# Patient Record
Sex: Female | Born: 1935 | Race: White | Hispanic: No | Marital: Married | Smoking: Never smoker
Health system: Southern US, Community
[De-identification: ages and names within clinical notes are randomized; demographics above are authoritative.]

## PROBLEM LIST (undated history)

## (undated) DIAGNOSIS — K851 Biliary acute pancreatitis without necrosis or infection: Secondary | ICD-10-CM

## (undated) DIAGNOSIS — K2270 Barrett's esophagus without dysplasia: Secondary | ICD-10-CM

## (undated) DIAGNOSIS — R269 Unspecified abnormalities of gait and mobility: Secondary | ICD-10-CM

## (undated) DIAGNOSIS — I10 Essential (primary) hypertension: Secondary | ICD-10-CM

## (undated) DIAGNOSIS — M1990 Unspecified osteoarthritis, unspecified site: Secondary | ICD-10-CM

## (undated) DIAGNOSIS — J8410 Pulmonary fibrosis, unspecified: Secondary | ICD-10-CM

## (undated) DIAGNOSIS — I4510 Unspecified right bundle-branch block: Secondary | ICD-10-CM

## (undated) DIAGNOSIS — R569 Unspecified convulsions: Secondary | ICD-10-CM

## (undated) DIAGNOSIS — D126 Benign neoplasm of colon, unspecified: Secondary | ICD-10-CM

## (undated) DIAGNOSIS — M353 Polymyalgia rheumatica: Secondary | ICD-10-CM

## (undated) DIAGNOSIS — K219 Gastro-esophageal reflux disease without esophagitis: Secondary | ICD-10-CM

## (undated) DIAGNOSIS — D649 Anemia, unspecified: Secondary | ICD-10-CM

## (undated) DIAGNOSIS — R112 Nausea with vomiting, unspecified: Secondary | ICD-10-CM

## (undated) DIAGNOSIS — R5381 Other malaise: Secondary | ICD-10-CM

## (undated) DIAGNOSIS — K644 Residual hemorrhoidal skin tags: Secondary | ICD-10-CM

## (undated) DIAGNOSIS — E785 Hyperlipidemia, unspecified: Secondary | ICD-10-CM

## (undated) DIAGNOSIS — E119 Type 2 diabetes mellitus without complications: Secondary | ICD-10-CM

## (undated) DIAGNOSIS — R5383 Other fatigue: Secondary | ICD-10-CM

## (undated) DIAGNOSIS — Z8719 Personal history of other diseases of the digestive system: Secondary | ICD-10-CM

## (undated) DIAGNOSIS — Z87898 Personal history of other specified conditions: Secondary | ICD-10-CM

## (undated) DIAGNOSIS — Z9889 Other specified postprocedural states: Secondary | ICD-10-CM

## (undated) DIAGNOSIS — K5792 Diverticulitis of intestine, part unspecified, without perforation or abscess without bleeding: Secondary | ICD-10-CM

## (undated) DIAGNOSIS — N390 Urinary tract infection, site not specified: Secondary | ICD-10-CM

## (undated) HISTORY — PX: BACK SURGERY: SHX140

## (undated) HISTORY — PX: OTHER SURGICAL HISTORY: SHX169

## (undated) HISTORY — PX: WRIST FRACTURE SURGERY: SHX121

## (undated) HISTORY — DX: Anemia, unspecified: D64.9

## (undated) HISTORY — DX: Personal history of other specified conditions: Z87.898

## (undated) HISTORY — DX: Unspecified right bundle-branch block: I45.10

## (undated) HISTORY — DX: Biliary acute pancreatitis without necrosis or infection: K85.10

## (undated) HISTORY — DX: Hyperlipidemia, unspecified: E78.5

## (undated) HISTORY — DX: Barrett's esophagus without dysplasia: K22.70

## (undated) HISTORY — DX: Personal history of other diseases of the digestive system: Z87.19

## (undated) HISTORY — PX: ESOPHAGOGASTRODUODENOSCOPY: SHX1529

## (undated) HISTORY — PX: CYSTOSCOPY: SUR368

## (undated) HISTORY — DX: Type 2 diabetes mellitus without complications: E11.9

## (undated) HISTORY — DX: Essential (primary) hypertension: I10

## (undated) HISTORY — PX: DILATION AND CURETTAGE OF UTERUS: SHX78

## (undated) HISTORY — DX: Diverticulitis of intestine, part unspecified, without perforation or abscess without bleeding: K57.92

## (undated) HISTORY — DX: Other malaise: R53.81

## (undated) HISTORY — DX: Polymyalgia rheumatica: M35.3

## (undated) HISTORY — PX: TOTAL ABDOMINAL HYSTERECTOMY W/ BILATERAL SALPINGOOPHORECTOMY: SHX83

## (undated) HISTORY — DX: Unspecified osteoarthritis, unspecified site: M19.90

## (undated) HISTORY — DX: Pulmonary fibrosis, unspecified: J84.10

## (undated) HISTORY — DX: Other fatigue: R53.83

## (undated) HISTORY — DX: Benign neoplasm of colon, unspecified: D12.6

## (undated) HISTORY — PX: TONSILLECTOMY: SUR1361

## (undated) HISTORY — PX: CYSTECTOMY: SUR359

## (undated) HISTORY — PX: COLONOSCOPY W/ POLYPECTOMY: SHX1380

## (undated) HISTORY — DX: Unspecified abnormalities of gait and mobility: R26.9

## (undated) HISTORY — DX: Residual hemorrhoidal skin tags: K64.4

## (undated) HISTORY — PX: APPENDECTOMY: SHX54

---

## 1978-05-06 HISTORY — PX: ORIF SHOULDER FRACTURE: SHX5035

## 1993-05-06 DIAGNOSIS — K5792 Diverticulitis of intestine, part unspecified, without perforation or abscess without bleeding: Secondary | ICD-10-CM

## 1993-05-06 HISTORY — DX: Diverticulitis of intestine, part unspecified, without perforation or abscess without bleeding: K57.92

## 1997-08-26 ENCOUNTER — Other Ambulatory Visit: Admission: RE | Admit: 1997-08-26 | Discharge: 1997-08-26 | Payer: Self-pay | Admitting: Obstetrics and Gynecology

## 1998-11-27 ENCOUNTER — Other Ambulatory Visit: Admission: RE | Admit: 1998-11-27 | Discharge: 1998-11-27 | Payer: Self-pay | Admitting: Obstetrics and Gynecology

## 2000-03-14 ENCOUNTER — Other Ambulatory Visit: Admission: RE | Admit: 2000-03-14 | Discharge: 2000-03-14 | Payer: Self-pay | Admitting: Obstetrics and Gynecology

## 2001-03-16 ENCOUNTER — Other Ambulatory Visit: Admission: RE | Admit: 2001-03-16 | Discharge: 2001-03-16 | Payer: Self-pay | Admitting: Obstetrics and Gynecology

## 2001-05-06 HISTORY — PX: CHOLECYSTECTOMY: SHX55

## 2001-12-31 ENCOUNTER — Encounter: Payer: Self-pay | Admitting: Internal Medicine

## 2001-12-31 ENCOUNTER — Encounter: Admission: RE | Admit: 2001-12-31 | Discharge: 2001-12-31 | Payer: Self-pay | Admitting: Internal Medicine

## 2002-01-21 ENCOUNTER — Encounter: Payer: Self-pay | Admitting: Neurosurgery

## 2002-01-21 ENCOUNTER — Encounter (INDEPENDENT_AMBULATORY_CARE_PROVIDER_SITE_OTHER): Payer: Self-pay | Admitting: *Deleted

## 2002-01-21 ENCOUNTER — Inpatient Hospital Stay (HOSPITAL_COMMUNITY): Admission: RE | Admit: 2002-01-21 | Discharge: 2002-01-22 | Payer: Self-pay | Admitting: Neurosurgery

## 2002-02-17 ENCOUNTER — Encounter: Payer: Self-pay | Admitting: Internal Medicine

## 2002-02-17 ENCOUNTER — Encounter: Admission: RE | Admit: 2002-02-17 | Discharge: 2002-02-17 | Payer: Self-pay | Admitting: Internal Medicine

## 2002-02-18 ENCOUNTER — Inpatient Hospital Stay (HOSPITAL_COMMUNITY): Admission: EM | Admit: 2002-02-18 | Discharge: 2002-02-21 | Payer: Self-pay | Admitting: Internal Medicine

## 2002-02-19 ENCOUNTER — Encounter (INDEPENDENT_AMBULATORY_CARE_PROVIDER_SITE_OTHER): Payer: Self-pay | Admitting: Specialist

## 2002-04-15 DIAGNOSIS — K644 Residual hemorrhoidal skin tags: Secondary | ICD-10-CM | POA: Insufficient documentation

## 2003-02-10 ENCOUNTER — Ambulatory Visit (HOSPITAL_COMMUNITY): Admission: RE | Admit: 2003-02-10 | Discharge: 2003-02-10 | Payer: Self-pay | Admitting: Internal Medicine

## 2003-05-07 DIAGNOSIS — K851 Biliary acute pancreatitis without necrosis or infection: Secondary | ICD-10-CM

## 2003-05-07 HISTORY — DX: Biliary acute pancreatitis without necrosis or infection: K85.10

## 2003-08-21 ENCOUNTER — Inpatient Hospital Stay (HOSPITAL_COMMUNITY): Admission: EM | Admit: 2003-08-21 | Discharge: 2003-08-24 | Payer: Self-pay | Admitting: Emergency Medicine

## 2003-08-23 ENCOUNTER — Encounter (INDEPENDENT_AMBULATORY_CARE_PROVIDER_SITE_OTHER): Payer: Self-pay | Admitting: *Deleted

## 2004-03-07 ENCOUNTER — Ambulatory Visit: Payer: Self-pay | Admitting: Internal Medicine

## 2004-03-12 ENCOUNTER — Ambulatory Visit: Payer: Self-pay | Admitting: Internal Medicine

## 2004-03-27 ENCOUNTER — Ambulatory Visit: Payer: Self-pay | Admitting: Internal Medicine

## 2004-05-14 ENCOUNTER — Ambulatory Visit: Payer: Self-pay | Admitting: Internal Medicine

## 2004-05-15 ENCOUNTER — Ambulatory Visit: Payer: Self-pay | Admitting: Internal Medicine

## 2004-05-15 ENCOUNTER — Encounter: Payer: Self-pay | Admitting: Internal Medicine

## 2004-12-20 IMAGING — RF DG CHOLANGIOGRAM OPERATIVE
1 series · 1 of 1 positions shown · non-contrast
Comparison: none

CLINICAL DATA: Laparoscopic cholecystectomy
 OPERATIVE CHOLANGIOGRAM
 Electronic spine image printed from the C-arm in the operating room shows normal caliber intra and extrahepatic biliary ducts with no filling defects or obstruction.
 IMPRESSION
 Satisfactory operative cholangiogram.

[Series 1: run · 1 of 1 slices shown]
[im 1/1]
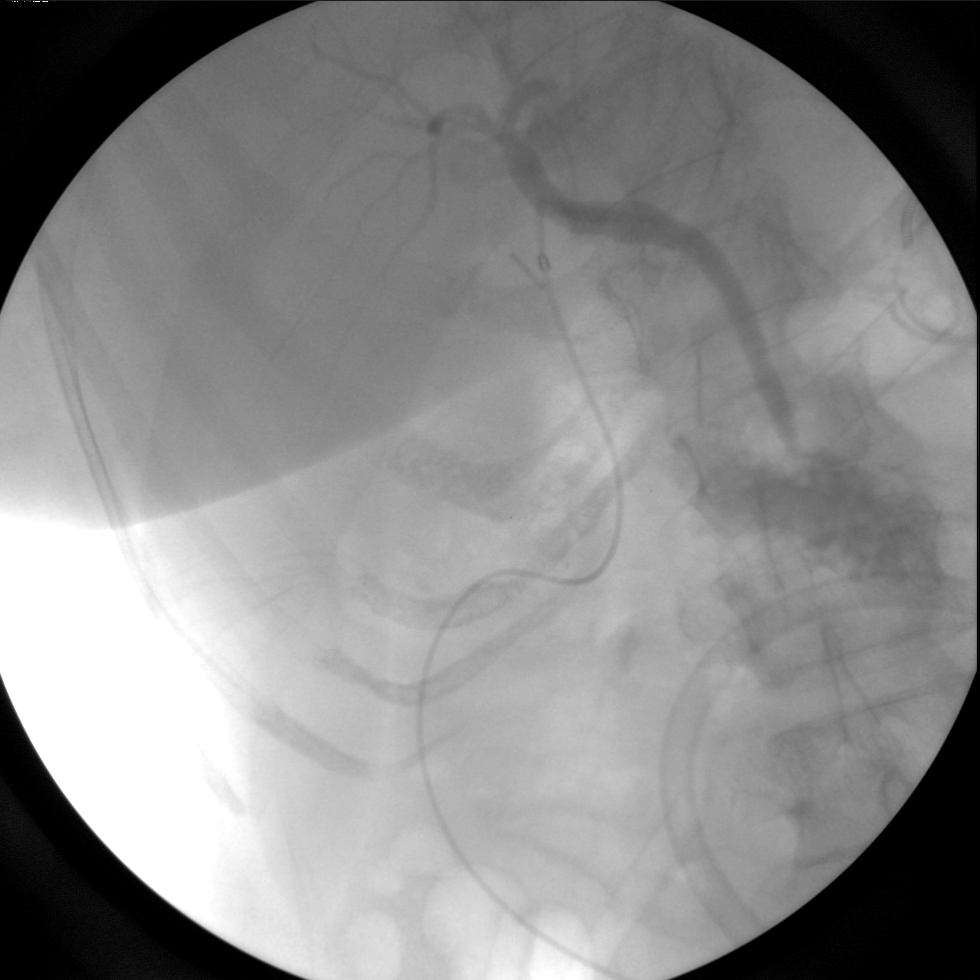

[1 of 1 positions shown; findings below may reference images not displayed]

## 2005-03-13 ENCOUNTER — Ambulatory Visit: Payer: Self-pay | Admitting: Internal Medicine

## 2005-03-19 ENCOUNTER — Ambulatory Visit: Payer: Self-pay | Admitting: Family Medicine

## 2005-03-26 ENCOUNTER — Encounter: Payer: Self-pay | Admitting: Internal Medicine

## 2006-05-21 ENCOUNTER — Ambulatory Visit: Payer: Self-pay | Admitting: Internal Medicine

## 2006-05-21 LAB — CONVERTED CEMR LAB: AST: 24 units/L (ref 0–37)

## 2006-06-11 ENCOUNTER — Encounter (INDEPENDENT_AMBULATORY_CARE_PROVIDER_SITE_OTHER): Payer: Self-pay | Admitting: *Deleted

## 2006-06-11 LAB — CONVERTED CEMR LAB

## 2006-09-17 ENCOUNTER — Ambulatory Visit: Payer: Self-pay | Admitting: Internal Medicine

## 2006-09-17 ENCOUNTER — Encounter: Payer: Self-pay | Admitting: Internal Medicine

## 2006-09-17 LAB — CONVERTED CEMR LAB
Basophils Relative: 0.6 % (ref 0.0–1.0)
Creatinine,U: 48.1 mg/dL
Eosinophils Relative: 5.3 % — ABNORMAL HIGH (ref 0.0–5.0)
Folate: >20 ng/mL
HCT: 36.2 % (ref 36.0–46.0)
Hemoglobin: 12.2 g/dL (ref 12.0–15.0)
Hgb A1c MFr Bld: 6.2 % — ABNORMAL HIGH (ref 4.6–6.0)
Iron: 36 ug/dL — ABNORMAL LOW (ref 42–145)
Lymphocytes Relative: 33.1 % (ref 12.0–46.0)
Microalb Creat Ratio: 16.6 mg/g (ref 0.0–30.0)
Neutro Abs: 2.3 10*3/uL (ref 1.4–7.7)
Neutrophils Relative %: 49.2 % (ref 43.0–77.0)
Platelets: 377 10*3/uL (ref 150–400)
WBC: 4.8 10*3/uL (ref 4.5–10.5)

## 2006-10-03 ENCOUNTER — Encounter: Payer: Self-pay | Admitting: Internal Medicine

## 2007-01-06 ENCOUNTER — Ambulatory Visit: Payer: Self-pay | Admitting: Internal Medicine

## 2007-01-09 ENCOUNTER — Encounter (INDEPENDENT_AMBULATORY_CARE_PROVIDER_SITE_OTHER): Payer: Self-pay | Admitting: *Deleted

## 2007-01-09 LAB — CONVERTED CEMR LAB
BUN: 12 mg/dL (ref 6–23)
Basophils Relative: 0.1 % (ref 0.0–1.0)
Creatinine, Ser: 0.8 mg/dL (ref 0.4–1.2)
Eosinophils Absolute: 0.3 10*3/uL (ref 0.0–0.6)
Eosinophils Relative: 5.8 % — ABNORMAL HIGH (ref 0.0–5.0)
HCT: 33.7 % — ABNORMAL LOW (ref 36.0–46.0)
Hemoglobin: 11.7 g/dL — ABNORMAL LOW (ref 12.0–15.0)
Lymphocytes Relative: 32.3 % (ref 12.0–46.0)
Neutro Abs: 2.1 10*3/uL (ref 1.4–7.7)
Neutrophils Relative %: 48.1 % (ref 43.0–77.0)
Platelets: 325 10*3/uL (ref 150–400)
RBC: 4.16 M/uL (ref 3.87–5.11)
WBC: 4.4 10*3/uL — ABNORMAL LOW (ref 4.5–10.5)

## 2007-01-20 ENCOUNTER — Ambulatory Visit: Payer: Self-pay | Admitting: Internal Medicine

## 2007-01-20 ENCOUNTER — Telehealth: Payer: Self-pay | Admitting: Internal Medicine

## 2007-01-20 DIAGNOSIS — D126 Benign neoplasm of colon, unspecified: Secondary | ICD-10-CM

## 2007-01-20 DIAGNOSIS — K2270 Barrett's esophagus without dysplasia: Secondary | ICD-10-CM

## 2007-01-29 ENCOUNTER — Ambulatory Visit: Payer: Self-pay | Admitting: Internal Medicine

## 2007-01-30 ENCOUNTER — Encounter (INDEPENDENT_AMBULATORY_CARE_PROVIDER_SITE_OTHER): Payer: Self-pay | Admitting: *Deleted

## 2007-02-17 ENCOUNTER — Ambulatory Visit: Payer: Self-pay | Admitting: Internal Medicine

## 2007-02-17 LAB — CONVERTED CEMR LAB
Basophils Absolute: 0.1 10*3/uL (ref 0.0–0.1)
Basophils Relative: 0.8 % (ref 0.0–1.0)
Folate: >20 ng/mL
Monocytes Relative: 12 % — ABNORMAL HIGH (ref 3.0–11.0)
Neutro Abs: 3.6 10*3/uL (ref 1.4–7.7)
Platelets: 377 10*3/uL (ref 150–400)
RDW: 14 % (ref 11.5–14.6)
Vitamin B-12: 878 pg/mL (ref 211–911)

## 2007-02-24 ENCOUNTER — Encounter: Payer: Self-pay | Admitting: Internal Medicine

## 2007-02-24 ENCOUNTER — Ambulatory Visit: Payer: Self-pay | Admitting: Internal Medicine

## 2007-03-24 ENCOUNTER — Ambulatory Visit: Payer: Self-pay | Admitting: Internal Medicine

## 2007-03-24 LAB — CONVERTED CEMR LAB
Basophils Relative: 0.6 % (ref 0.0–1.0)
Eosinophils Relative: 2.3 % (ref 0.0–5.0)
HCT: 35.2 % — ABNORMAL LOW (ref 36.0–46.0)
Platelets: 417 10*3/uL — ABNORMAL HIGH (ref 150–400)
RBC: 4.26 M/uL (ref 3.87–5.11)
WBC: 6.6 10*3/uL (ref 4.5–10.5)

## 2007-05-25 ENCOUNTER — Ambulatory Visit: Payer: Self-pay | Admitting: Internal Medicine

## 2007-05-25 LAB — CONVERTED CEMR LAB
Basophils Absolute: 0 K/uL
Basophils Relative: 0.6 %
Eosinophils Absolute: 0.2 K/uL
Eosinophils Relative: 4.4 %
Ferritin: 11.6 ng/mL
HCT: 38.1 %
Hemoglobin: 13.1 g/dL
Lymphocytes Relative: 34 %
MCHC: 34.4 g/dL
MCV: 84.3 fL
Monocytes Absolute: 0.7 K/uL
Monocytes Relative: 12.3 % — ABNORMAL HIGH
Neutro Abs: 2.7 K/uL
Neutrophils Relative %: 48.7 %
Platelets: 335 K/uL
RBC: 4.52 M/uL
RDW: 16.1 % — ABNORMAL HIGH
WBC: 5.5 10*3/microliter

## 2007-05-29 ENCOUNTER — Telehealth (INDEPENDENT_AMBULATORY_CARE_PROVIDER_SITE_OTHER): Payer: Self-pay | Admitting: *Deleted

## 2007-06-01 ENCOUNTER — Telehealth (INDEPENDENT_AMBULATORY_CARE_PROVIDER_SITE_OTHER): Payer: Self-pay | Admitting: *Deleted

## 2007-06-04 ENCOUNTER — Telehealth (INDEPENDENT_AMBULATORY_CARE_PROVIDER_SITE_OTHER): Payer: Self-pay | Admitting: *Deleted

## 2007-06-23 ENCOUNTER — Ambulatory Visit: Payer: Self-pay | Admitting: Internal Medicine

## 2007-06-25 DIAGNOSIS — M1990 Unspecified osteoarthritis, unspecified site: Secondary | ICD-10-CM | POA: Insufficient documentation

## 2007-06-25 DIAGNOSIS — R7301 Impaired fasting glucose: Secondary | ICD-10-CM | POA: Insufficient documentation

## 2007-07-01 ENCOUNTER — Ambulatory Visit: Payer: Self-pay | Admitting: Internal Medicine

## 2007-07-02 ENCOUNTER — Encounter (INDEPENDENT_AMBULATORY_CARE_PROVIDER_SITE_OTHER): Payer: Self-pay | Admitting: *Deleted

## 2007-07-02 DIAGNOSIS — Z8719 Personal history of other diseases of the digestive system: Secondary | ICD-10-CM

## 2007-07-02 DIAGNOSIS — Z87898 Personal history of other specified conditions: Secondary | ICD-10-CM | POA: Insufficient documentation

## 2007-07-02 DIAGNOSIS — I4510 Unspecified right bundle-branch block: Secondary | ICD-10-CM

## 2007-07-02 DIAGNOSIS — L980 Pyogenic granuloma: Secondary | ICD-10-CM

## 2007-07-02 DIAGNOSIS — N390 Urinary tract infection, site not specified: Secondary | ICD-10-CM | POA: Insufficient documentation

## 2007-07-08 ENCOUNTER — Ambulatory Visit: Payer: Self-pay | Admitting: Internal Medicine

## 2007-07-08 ENCOUNTER — Encounter: Payer: Self-pay | Admitting: Internal Medicine

## 2007-07-09 ENCOUNTER — Encounter (INDEPENDENT_AMBULATORY_CARE_PROVIDER_SITE_OTHER): Payer: Self-pay | Admitting: *Deleted

## 2007-07-09 LAB — CONVERTED CEMR LAB
Eosinophils Absolute: 0.5 10*3/uL (ref 0.0–0.6)
HCT: 38.4 % (ref 36.0–46.0)
Hemoglobin: 12.6 g/dL (ref 12.0–15.0)
Hgb A1c MFr Bld: 6.1 % — ABNORMAL HIGH (ref 4.6–6.0)
MCHC: 32.8 g/dL (ref 30.0–36.0)
Neutro Abs: 2.5 10*3/uL (ref 1.4–7.7)
RDW: 14.8 % — ABNORMAL HIGH (ref 11.5–14.6)

## 2007-07-20 ENCOUNTER — Ambulatory Visit: Payer: Self-pay | Admitting: Internal Medicine

## 2007-07-20 DIAGNOSIS — I10 Essential (primary) hypertension: Secondary | ICD-10-CM | POA: Insufficient documentation

## 2007-07-20 DIAGNOSIS — D649 Anemia, unspecified: Secondary | ICD-10-CM

## 2007-07-20 DIAGNOSIS — M810 Age-related osteoporosis without current pathological fracture: Secondary | ICD-10-CM | POA: Insufficient documentation

## 2007-08-17 ENCOUNTER — Encounter: Payer: Self-pay | Admitting: Internal Medicine

## 2007-10-23 ENCOUNTER — Encounter: Payer: Self-pay | Admitting: Internal Medicine

## 2008-01-20 ENCOUNTER — Ambulatory Visit: Payer: Self-pay | Admitting: Internal Medicine

## 2008-01-24 LAB — CONVERTED CEMR LAB
Basophils Relative: 0.8 % (ref 0.0–3.0)
Eosinophils Absolute: 0.2 10*3/uL (ref 0.0–0.7)
Eosinophils Relative: 4.2 % (ref 0.0–5.0)
HCT: 38.2 % (ref 36.0–46.0)
Hemoglobin: 13 g/dL (ref 12.0–15.0)
MCV: 88.1 fL (ref 78.0–100.0)
Monocytes Absolute: 0.6 10*3/uL (ref 0.1–1.0)
Neutro Abs: 2.8 10*3/uL (ref 1.4–7.7)
Neutrophils Relative %: 53 % (ref 43.0–77.0)
RBC: 4.34 M/uL (ref 3.87–5.11)
WBC: 5.2 10*3/uL (ref 4.5–10.5)

## 2008-01-25 ENCOUNTER — Encounter (INDEPENDENT_AMBULATORY_CARE_PROVIDER_SITE_OTHER): Payer: Self-pay | Admitting: *Deleted

## 2008-04-25 ENCOUNTER — Telehealth (INDEPENDENT_AMBULATORY_CARE_PROVIDER_SITE_OTHER): Payer: Self-pay | Admitting: *Deleted

## 2008-07-29 ENCOUNTER — Telehealth (INDEPENDENT_AMBULATORY_CARE_PROVIDER_SITE_OTHER): Payer: Self-pay | Admitting: *Deleted

## 2008-08-15 ENCOUNTER — Ambulatory Visit: Payer: Self-pay | Admitting: Internal Medicine

## 2008-08-15 DIAGNOSIS — K5900 Constipation, unspecified: Secondary | ICD-10-CM | POA: Insufficient documentation

## 2008-08-16 ENCOUNTER — Encounter: Payer: Self-pay | Admitting: Internal Medicine

## 2008-08-18 LAB — CONVERTED CEMR LAB: Vit D, 25-Hydroxy: 40 ng/mL (ref 30–89)

## 2008-08-19 ENCOUNTER — Encounter (INDEPENDENT_AMBULATORY_CARE_PROVIDER_SITE_OTHER): Payer: Self-pay | Admitting: *Deleted

## 2008-08-24 ENCOUNTER — Encounter (INDEPENDENT_AMBULATORY_CARE_PROVIDER_SITE_OTHER): Payer: Self-pay | Admitting: *Deleted

## 2008-08-24 LAB — CONVERTED CEMR LAB
ALT: 22 units/L (ref 0–35)
AST: 26 units/L (ref 0–37)
Albumin: 4.1 g/dL (ref 3.5–5.2)
Alkaline Phosphatase: 49 units/L (ref 39–117)
BUN: 14 mg/dL (ref 6–23)
Basophils Absolute: 0.1 10*3/uL (ref 0.0–0.1)
Basophils Relative: 1.4 % (ref 0.0–3.0)
Bilirubin, Direct: 0.1 mg/dL (ref 0.0–0.3)
Cholesterol: 184 mg/dL (ref 0–200)
Creatinine, Ser: 1 mg/dL (ref 0.4–1.2)
Eosinophils Absolute: 0.3 10*3/uL (ref 0.0–0.7)
Eosinophils Relative: 6.3 % — ABNORMAL HIGH (ref 0.0–5.0)
HCT: 38.2 % (ref 36.0–46.0)
HDL: 66.3 mg/dL (ref >39.00)
Hemoglobin: 12.7 g/dL (ref 12.0–15.0)
Hgb A1c MFr Bld: 6 % (ref 4.6–6.5)
LDL Cholesterol: 104 mg/dL — ABNORMAL HIGH (ref 0–99)
Lymphocytes Relative: 31.3 % (ref 12.0–46.0)
Lymphs Abs: 1.6 10*3/uL (ref 0.7–4.0)
MCHC: 33.2 g/dL (ref 30.0–36.0)
MCV: 88.3 fL (ref 78.0–100.0)
Monocytes Absolute: 0.4 10*3/uL (ref 0.1–1.0)
Monocytes Relative: 7.3 % (ref 3.0–12.0)
Neutro Abs: 2.7 10*3/uL (ref 1.4–7.7)
Neutrophils Relative %: 53.7 % (ref 43.0–77.0)
Platelets: 294 10*3/uL (ref 150.0–400.0)
Potassium: 4 meq/L (ref 3.5–5.1)
RBC: 4.33 M/uL (ref 3.87–5.11)
RDW: 13.3 % (ref 11.5–14.6)
TSH: 2.67 microintl units/mL (ref 0.35–5.50)
Total Bilirubin: 0.9 mg/dL (ref 0.3–1.2)
Total CHOL/HDL Ratio: 3
Total Protein: 6.8 g/dL (ref 6.0–8.3)
Triglycerides: 70 mg/dL (ref 0.0–149.0)
VLDL: 14 mg/dL (ref 0.0–40.0)
WBC: 5.1 10*3/uL (ref 4.5–10.5)

## 2008-09-12 ENCOUNTER — Ambulatory Visit: Payer: Self-pay | Admitting: Internal Medicine

## 2008-09-12 DIAGNOSIS — J309 Allergic rhinitis, unspecified: Secondary | ICD-10-CM | POA: Insufficient documentation

## 2008-09-12 DIAGNOSIS — R269 Unspecified abnormalities of gait and mobility: Secondary | ICD-10-CM | POA: Insufficient documentation

## 2008-09-12 DIAGNOSIS — R519 Headache, unspecified: Secondary | ICD-10-CM | POA: Insufficient documentation

## 2008-09-12 DIAGNOSIS — R51 Headache: Secondary | ICD-10-CM

## 2008-09-13 LAB — CONVERTED CEMR LAB
BUN: 13 mg/dL (ref 6–23)
Creatinine, Ser: 0.8 mg/dL (ref 0.4–1.2)
Potassium: 4 meq/L (ref 3.5–5.1)

## 2008-09-15 ENCOUNTER — Ambulatory Visit: Payer: Self-pay | Admitting: Cardiovascular Disease

## 2008-09-16 ENCOUNTER — Telehealth (INDEPENDENT_AMBULATORY_CARE_PROVIDER_SITE_OTHER): Payer: Self-pay | Admitting: *Deleted

## 2008-09-16 ENCOUNTER — Encounter (INDEPENDENT_AMBULATORY_CARE_PROVIDER_SITE_OTHER): Payer: Self-pay | Admitting: *Deleted

## 2008-10-31 ENCOUNTER — Telehealth (INDEPENDENT_AMBULATORY_CARE_PROVIDER_SITE_OTHER): Payer: Self-pay | Admitting: *Deleted

## 2008-11-03 ENCOUNTER — Telehealth: Payer: Self-pay | Admitting: Internal Medicine

## 2008-11-08 ENCOUNTER — Ambulatory Visit: Payer: Self-pay | Admitting: Internal Medicine

## 2008-11-10 ENCOUNTER — Encounter (INDEPENDENT_AMBULATORY_CARE_PROVIDER_SITE_OTHER): Payer: Self-pay | Admitting: *Deleted

## 2008-11-12 ENCOUNTER — Encounter: Admission: RE | Admit: 2008-11-12 | Discharge: 2008-11-12 | Payer: Self-pay | Admitting: Internal Medicine

## 2008-11-14 ENCOUNTER — Encounter: Payer: Self-pay | Admitting: Internal Medicine

## 2008-11-15 ENCOUNTER — Telehealth (INDEPENDENT_AMBULATORY_CARE_PROVIDER_SITE_OTHER): Payer: Self-pay | Admitting: *Deleted

## 2008-11-28 ENCOUNTER — Ambulatory Visit: Payer: Self-pay

## 2008-12-22 ENCOUNTER — Telehealth (INDEPENDENT_AMBULATORY_CARE_PROVIDER_SITE_OTHER): Payer: Self-pay | Admitting: *Deleted

## 2008-12-22 ENCOUNTER — Encounter: Payer: Self-pay | Admitting: Internal Medicine

## 2008-12-30 ENCOUNTER — Ambulatory Visit: Payer: Self-pay

## 2008-12-30 ENCOUNTER — Encounter (INDEPENDENT_AMBULATORY_CARE_PROVIDER_SITE_OTHER): Payer: Self-pay | Admitting: Neurology

## 2008-12-30 ENCOUNTER — Encounter: Payer: Self-pay | Admitting: Internal Medicine

## 2009-03-27 ENCOUNTER — Encounter: Payer: Self-pay | Admitting: Internal Medicine

## 2009-04-04 ENCOUNTER — Telehealth: Payer: Self-pay | Admitting: Internal Medicine

## 2009-04-05 ENCOUNTER — Ambulatory Visit: Payer: Self-pay | Admitting: Internal Medicine

## 2009-04-05 LAB — CONVERTED CEMR LAB
Bilirubin Urine: NEGATIVE
Protein, U semiquant: NEGATIVE
Specific Gravity, Urine: <1.005
Urobilinogen, UA: 0.2

## 2009-04-07 ENCOUNTER — Telehealth (INDEPENDENT_AMBULATORY_CARE_PROVIDER_SITE_OTHER): Payer: Self-pay | Admitting: *Deleted

## 2009-05-31 ENCOUNTER — Telehealth (INDEPENDENT_AMBULATORY_CARE_PROVIDER_SITE_OTHER): Payer: Self-pay | Admitting: *Deleted

## 2009-10-10 ENCOUNTER — Telehealth (INDEPENDENT_AMBULATORY_CARE_PROVIDER_SITE_OTHER): Payer: Self-pay | Admitting: *Deleted

## 2009-10-13 ENCOUNTER — Encounter: Payer: Self-pay | Admitting: Internal Medicine

## 2009-11-14 ENCOUNTER — Encounter: Payer: Self-pay | Admitting: Internal Medicine

## 2009-11-14 ENCOUNTER — Ambulatory Visit: Payer: Self-pay | Admitting: Internal Medicine

## 2010-01-25 ENCOUNTER — Encounter: Payer: Self-pay | Admitting: Internal Medicine

## 2010-02-05 ENCOUNTER — Telehealth (INDEPENDENT_AMBULATORY_CARE_PROVIDER_SITE_OTHER): Payer: Self-pay | Admitting: *Deleted

## 2010-02-24 ENCOUNTER — Ambulatory Visit: Payer: Self-pay | Admitting: Family Medicine

## 2010-02-24 LAB — CONVERTED CEMR LAB
Bilirubin Urine: NEGATIVE
Ketones, urine, test strip: NEGATIVE
Urobilinogen, UA: 0.2

## 2010-02-26 ENCOUNTER — Telehealth: Payer: Self-pay | Admitting: Internal Medicine

## 2010-04-24 ENCOUNTER — Telehealth (INDEPENDENT_AMBULATORY_CARE_PROVIDER_SITE_OTHER): Payer: Self-pay | Admitting: *Deleted

## 2010-05-01 ENCOUNTER — Telehealth (INDEPENDENT_AMBULATORY_CARE_PROVIDER_SITE_OTHER): Payer: Self-pay | Admitting: *Deleted

## 2010-06-05 NOTE — Progress Notes (Signed)
Summary: Due for BMD  Phone Note Outgoing Call Call back at [facility] Phone [phone]   Caller: Patient Initial call taken by: Shonna Chock,  October 10, 2009 3:26 PM Call placed by: Shonna Chock,  October 10, 2009 3:27 PM Call placed to: Patient Summary of Call: Called patient to see when her last Bone Density was, the last one we have on file is 03/19/2005. Patient stated that was her last one and is ok with [place] setting her up for one. I informed patient this is necessary to continue refilling Boniva   Chrae Malloy  October 10, 2009 3:28 PM

## 2010-06-05 NOTE — Progress Notes (Signed)
Summary: med.Non Adherence Therapy Advisory   Phone Note Refill Request Message from:  Fax from Pharmacy on May 31, 2009 4:21 PM  Refills Requested: Medication #1:  METFORMIN HCL 500 MG  TABS 1 by mouth two times a day with largest meals NOT a refill. [facility] sent a medication Non Adherence Therapy advisory for this med.  Initial call taken by: Michaelle Copas,  May 31, 2009 4:22 PM  Follow-up for Phone Call        Noted. Per Dr.Hopper this can be signed off on Follow-up by: Shonna Chock,  May 31, 2009 4:42 PM    m

## 2010-06-05 NOTE — Assessment & Plan Note (Signed)
Summary: UTI?/RCD   Vital Signs:  Patient profile:   77 year old female Height:      57.5 inches Weight:      116 pounds BMI:     24.76 Temp:     97.6 degrees F oral BP sitting:   112 / 74  (left arm)  Vitals Entered By: Doristine Devoid CMA (February 24, 2010 12:32 PM) CC: UTI having some dysuria   History of Present Illness: 77 yo woman here today for dysuria and frequency.  woke up every hour overnight.  + urgency.  sxs started Thursday.  denies blood.  no fevers/chills.  + suprapubic pain.  no back pain.  hx of recurrent UTIs.  Current Medications (verified): 1)  Boniva 150 Mg  Tabs (Ibandronate Sodium) .... Take 1 Monthly 2)  Amlodipine Besy-Benazepril Hcl 10-20 Mg  Caps (Amlodipine Besy-Benazepril Hcl) .Marland Kitchen.. 1 By Mouth Qd 3)  Metformin Hcl 500 Mg  Tabs (Metformin Hcl) .Marland Kitchen.. 1 By Mouth Two Times A Day With Largest Meals- Office Visit and Labs Due Now 4)  Lumigan 0.03 %  Soln (Bimatoprost) 5)  Oscal Plus D 6)  Multivitamin 7)  Vit D400iu 8)  Prilosec 20 Mg  Cpdr (Omeprazole) .Marland Kitchen.. 1 By Mouth Once Daily 9)  Levetiracetam 500 Mg Tabs (Levetiracetam) .Marland Kitchen.. 1 By Mouth Two Times A Day 10)  Pyridium 100 Mg Tabs (Phenazopyridine Hcl) .Marland Kitchen.. 1 By Mouth Three Times A Day As Needed Burning 11)  Cephalexin 500 Mg  Tabs (Cephalexin) .... Take One By Mouth 2 Times Daily X5 Days  Allergies (verified): 1)  ! Sulfa  Review of Systems      See HPI  Physical Exam  General:  in no acute distress; alert,appropriate and cooperative throughout examination Abdomen:  Bowel sounds positive,abdomen soft and non-tender."pressure " over suprapubic area.  no CVA tenderness   Impression & Recommendations:  Problem # 1:  DYSURIA (ICD-788.1) Assessment Unchanged  UA w/ LE, sxs consistant w/ infxn.  start Keflex.  send urine for cx.  reviewed supportive care and red flags that should prompt return.  Pt expresses understanding and is in agreement w/ this plan. Her updated medication list for this problem  includes:    Pyridium 100 Mg Tabs (Phenazopyridine hcl) .Marland Kitchen... 1 by mouth three times a day as needed burning    Cephalexin 500 Mg Tabs (Cephalexin) .Marland Kitchen... Take one by mouth 2 times daily x5 days  Orders: Prescription Created Electronically [phone])  Complete Medication List: 1)  Boniva 150 Mg Tabs (Ibandronate sodium) .... Take 1 monthly 2)  Amlodipine Besy-benazepril Hcl 10-20 Mg Caps (Amlodipine besy-benazepril hcl) .Marland Kitchen.. 1 by mouth qd 3)  Metformin Hcl 500 Mg Tabs (Metformin hcl) .Marland Kitchen.. 1 by mouth two times a day with largest meals- office visit and labs due now 4)  Lumigan 0.03 % Soln (Bimatoprost) 5)  Oscal Plus D  6)  Multivitamin  7)  Vit D400iu  8)  Prilosec 20 Mg Cpdr (Omeprazole) .Marland Kitchen.. 1 by mouth once daily 9)  Levetiracetam 500 Mg Tabs (Levetiracetam) .Marland Kitchen.. 1 by mouth two times a day 10)  Pyridium 100 Mg Tabs (Phenazopyridine hcl) .Marland Kitchen.. 1 by mouth three times a day as needed burning 11)  Cephalexin 500 Mg Tabs (Cephalexin) .... Take one by mouth 2 times daily x5 days  Other Orders: UA Dipstick w/o Micro (manual) (60454) Specimen Handling (99000) T-Culture, Urine (09811-91478)  Patient Instructions: 1)  This appears to be an infection 2)  Take the Cephalexin two times a day  as directed- take w/ food to avoid upset stomach 3)  Drink plenty of fluids 4)  We'll call you if we need to change your medicine 5)  Call with any questions or concerns 6)  Hang in there!! Prescriptions: CEPHALEXIN 500 MG  TABS (CEPHALEXIN) take one by mouth 2 times daily x5 days  #10 x 0   Entered and Authorized by:   Neena Rhymes MD   Signed by:   Neena Rhymes MD on 02/24/2010   Method used:   Electronically to        [id number]       [address]       [place], [place]  [address]       Ph: [phone] or [phone]       Fax: [phone]   RxID:   [id number]    Orders Added: 1)  UA Dipstick w/o Micro (manual) [81002] 2)  Specimen Handling [99000] 3)  T-Culture,  Urine [60109-32355] 4)  Est. Patient Level III [73220] 5)  Prescription Created Electronically [phone]    Laboratory Results   Urine Tests    Routine Urinalysis   Glucose: negative   (Normal Range: Negative) Bilirubin: negative   (Normal Range: Negative) Ketone: negative   (Normal Range: Negative) Spec. Gravity: 1.015   (Normal Range: 1.003-1.035) Blood: negative   (Normal Range: Negative) pH: 6.0   (Normal Range: 5.0-8.0) Protein: negative   (Normal Range: Negative) Urobilinogen: 0.2   (Normal Range: 0-1) Nitrite: negative   (Normal Range: Negative) Leukocyte Esterace: trace   (Normal Range: Negative)

## 2010-06-05 NOTE — Progress Notes (Signed)
Summary: CALL-A-NURSE  Phone Note Outgoing Call   Details for Reason: Call-A-Nurse Triage Call Report Triage Record Num: [id number] Operator: Dayton Martes Patient Name: Shelley Navarro Call Date & Time: 02/24/2010 11:35:57AM Patient Phone: [phone] PCP: Marga Melnick Patient Gender: Female PCP Fax : [phone] Patient DOB: 10-May-1920 Practice Name: [facility] Reason for Call: Pt sts that she has urinary frequency and pain over the bladder; onset 02/21/10. Denies urgent emergent sx's. Care adv given. Transferred to [facility] in office for appt today. Protocol(s) Used: Urinary Symptoms - Female Recommended Outcome per Protocol: See Provider within 24 hours Reason for Outcome: Has one or more urinary tract symptoms Care Advice:  ~ Tell provider medical history of renal disease; especially if have only one kidney.  ~ Call provider if you develop flank or low back pain, fever, generally feel sick. Increase intake of fluids. Try to drink 8 oz. (.2 liter) every hour when awake, including unsweetened cranberry juice, unless on restricted fluids for other medical reasons. Take sips of fluid or eat ice chips if nauseated or vomiting.  ~  ~ Tell your provider if you are taking Warfarin (Coumadin) and drinking cranberry juice or taking cranberry capsules.  ~ SYMPTOM / CONDITION MANAGEMENT  ~ Call provider if urine is pink, red, smoky or cola colored. 10/ Summary of Call: Patient was seen for this by Tabori on Saturday. NFA required. Lucious Groves CMA  February 26, 2010 11:32 AM

## 2010-06-05 NOTE — Progress Notes (Signed)
Summary: EXCESSIVELY SLEEPY ON ANTIBIOTICS  Phone Note Call from Patient Call back at Home Phone [phone]   Caller: Patient Summary of Call: PATIENT CALLED TO REPORT THAT ANTIBIOTICS ARE MAKING HER VERY SLEEPY AND SHE SLEEPS ALL THE TIME,SHE  HAS TROUBLE WAKING UP AND IS VERY "WOOZY HEADED" WHEN SHE DOES WAKE UP---IS THIS NORMAL?? Initial call taken by: Jerolyn Shin,  February 26, 2010 2:56 PM  Follow-up for Phone Call        Please advise. Follow-up by: Lucious Groves CMA,  February 26, 2010 3:28 PM  Additional Follow-up for Phone Call Additional follow up Details #1::        Cephalexin will not cause sleepiness , but Pyridium for burning might Additional Follow-up by: Marga Melnick MD,  February 26, 2010 3:42 PM    Additional Follow-up for Phone Call Additional follow up Details #2::    Any alternatives or recommendations. Please advise. Lucious Groves CMA  February 26, 2010 4:10 PM   Additional Follow-up for Phone Call Additional follow up Details #3:: Details for Additional Follow-up Action Taken: finish antibiotics; push oral fluids up to 40 oz water / day Additional Follow-up by: Marga Melnick MD,  February 26, 2010 4:25 PM   Patient notified. Lucious Groves CMA  February 26, 2010 4:35 PM

## 2010-06-05 NOTE — Progress Notes (Signed)
Summary: METFORMIN REFILL  Phone Note Refill Request Message from:  Fax from Pharmacy on February 05, 2010 4:26 PM  Refills Requested: Medication #1:  METFORMIN HCL 500 MG  TABS 1 by mouth two times a day with largest meals [facility] [address], [place]  [phone]  Initial call taken by: Jerolyn Shin,  February 05, 2010 4:27 PM    New/Updated Medications: METFORMIN HCL 500 MG  TABS (METFORMIN HCL) 1 by mouth two times a day with largest meals- OFFICE VISIT AND LABS DUE NOW Prescriptions: METFORMIN HCL 500 MG  TABS (METFORMIN HCL) 1 by mouth two times a day with largest meals- OFFICE VISIT AND LABS DUE NOW  #60 x 0   Entered by:   Doristine Devoid CMA   Authorized by:   Marga Melnick MD   Signed by:   Doristine Devoid CMA on 02/06/2010   Method used:   Electronically to        [id number]       [address]       [place], [place]  [address]       Ph: [phone] or [phone]       Fax: [phone]   RxID:   [id number]

## 2010-06-05 NOTE — Miscellaneous (Signed)
Summary: BONE DENSITY  Clinical Lists Changes  Orders: Added new Test order of T-Bone Densitometry (77080) - Signed Added new Test order of T-Lumbar Vertebral Assessment (77082) - Signed 

## 2010-06-05 NOTE — Miscellaneous (Signed)
Summary: Flu/[facility]  Flu/[facility]   Imported By: Lanelle Bal 02/02/2010 13:10:45  _____________________________________________________________________  External Attachment:    Type:   Image     Comment:   External Document

## 2010-06-07 NOTE — Progress Notes (Signed)
Summary: refill  Phone Note Refill Request Message from:  Fax from Pharmacy on April 24, 2010 8:24 AM  Refills Requested: Medication #1:  METFORMIN HCL 500 MG  TABS **OFFICE VISIT AND LABS DUE NOW**1 by mouth two times a day with largest meals- [facility] [address] - fax [phone]  Initial call taken by: Okey Regal [place],  April 24, 2010 8:27 AM    Prescriptions: METFORMIN HCL 500 MG  TABS (METFORMIN HCL) **OFFICE VISIT AND LABS DUE NOW**1 by mouth two times a day with largest meals-  #60 Tablet x 0   Entered by:   Shonna Chock CMA   Authorized by:   Marga Melnick MD   Signed by:   Shonna Chock CMA on 04/24/2010   Method used:   Electronically to        [id number]       [address]       [place], [place]  [address]       Ph: [phone] or [phone]       Fax: [phone]   RxID:   [id number]

## 2010-06-07 NOTE — Progress Notes (Signed)
Summary: Metformin--needs approval for 90 day prescription  Phone Note Refill Request Message from:  Fax from Pharmacy on May 01, 2010 11:44 AM  Refills Requested: Medication #1:  METFORMIN HCL 500 MG  TABS **OFFICE VISIT AND LABS DUE NOW**1 by mouth two times a day with largest meals- [id number] [address], [place], [place]   phone=[phone],  fax = [phone]   qty = 90     NOTE:  received fax stating "Request for authorization to dispense 90 day supply--patient has Maintenance choice pharmacy benefit"  Next Appointment Scheduled: Thurs  06/28/2010     Hopper = CPX Initial call taken by: Jerolyn Shin,  May 01, 2010 11:45 AM    Prescriptions: METFORMIN HCL 500 MG  TABS (METFORMIN HCL) **OFFICE VISIT AND LABS DUE NOW**1 by mouth two times a day with largest meals-  #180 x 0   Entered by:   Shonna Chock CMA   Authorized by:   Marga Melnick MD   Signed by:   Shonna Chock CMA on 05/01/2010   Method used:   Electronically to        [id number]       [address]       [place], [place]  [address]       Ph: [phone] or [phone]       Fax: [phone]   RxID:   [id number]

## 2010-06-28 ENCOUNTER — Encounter: Payer: Self-pay | Admitting: Internal Medicine

## 2010-06-28 ENCOUNTER — Encounter (INDEPENDENT_AMBULATORY_CARE_PROVIDER_SITE_OTHER): Payer: Medicare Other | Admitting: Internal Medicine

## 2010-06-28 ENCOUNTER — Other Ambulatory Visit: Payer: Self-pay | Admitting: Internal Medicine

## 2010-06-28 DIAGNOSIS — T887XXA Unspecified adverse effect of drug or medicament, initial encounter: Secondary | ICD-10-CM

## 2010-06-28 DIAGNOSIS — I10 Essential (primary) hypertension: Secondary | ICD-10-CM

## 2010-06-28 DIAGNOSIS — Z0000 Encounter for general adult medical examination without abnormal findings: Secondary | ICD-10-CM

## 2010-06-28 DIAGNOSIS — E119 Type 2 diabetes mellitus without complications: Secondary | ICD-10-CM

## 2010-06-28 DIAGNOSIS — K2270 Barrett's esophagus without dysplasia: Secondary | ICD-10-CM

## 2010-06-28 DIAGNOSIS — Z23 Encounter for immunization: Secondary | ICD-10-CM

## 2010-06-28 LAB — MICROALBUMIN / CREATININE URINE RATIO
Creatinine,U: 55.2 mg/dL
Microalb, Ur: 0.8 mg/dL (ref 0.0–1.9)

## 2010-06-28 LAB — LIPID PANEL
Cholesterol: 189 mg/dL (ref 0–200)
HDL: 66.6 mg/dL (ref >39.00)
LDL Cholesterol: 107 mg/dL — ABNORMAL HIGH (ref 0–99)
Total CHOL/HDL Ratio: 3
VLDL: 15.6 mg/dL (ref 0.0–40.0)

## 2010-06-28 LAB — HEPATIC FUNCTION PANEL
AST: 21 U/L (ref 0–37)
Albumin: 4.2 g/dL (ref 3.5–5.2)
Alkaline Phosphatase: 57 U/L (ref 39–117)
Bilirubin, Direct: 0.1 mg/dL (ref 0.0–0.3)
Total Protein: 6.7 g/dL (ref 6.0–8.3)

## 2010-06-28 LAB — CBC WITH DIFFERENTIAL/PLATELET
Basophils Absolute: 0 10*3/uL (ref 0.0–0.1)
Eosinophils Absolute: 0.2 10*3/uL (ref 0.0–0.7)
HCT: 39.9 % (ref 36.0–46.0)
Lymphs Abs: 1.9 10*3/uL (ref 0.7–4.0)
MCHC: 33.9 g/dL (ref 30.0–36.0)
MCV: 87.8 fl (ref 78.0–100.0)
Monocytes Relative: 10.7 % (ref 3.0–12.0)
Neutrophils Relative %: 48.4 % (ref 43.0–77.0)
Platelets: 304 10*3/uL (ref 150.0–400.0)
RDW: 13.8 % (ref 11.5–14.6)

## 2010-06-28 LAB — POTASSIUM: Potassium: 5.1 mEq/L (ref 3.5–5.1)

## 2010-06-28 LAB — HEMOGLOBIN A1C: Hgb A1c MFr Bld: 6.1 % (ref 4.6–6.5)

## 2010-07-01 DIAGNOSIS — E785 Hyperlipidemia, unspecified: Secondary | ICD-10-CM | POA: Insufficient documentation

## 2010-07-01 DIAGNOSIS — Z8719 Personal history of other diseases of the digestive system: Secondary | ICD-10-CM | POA: Insufficient documentation

## 2010-07-12 NOTE — Assessment & Plan Note (Signed)
Summary: yearly check/kn---***per Chrae, check patient in when she com...   Vital Signs:  Patient profile:   77 year old female Height:      57.5 inches Weight:      113.6 pounds BMI:     24.24 Temp:     98.2 degrees F oral Pulse rate:   72 / minute Resp:     16 per minute BP sitting:   122 / 70  (left arm) Cuff size:   regular  Vitals Entered By: Shonna Chock CMA (June 28, 2010 11:23 AM) CC: CPX with fasting labs , Type 2 diabetes mellitus follow-up  Vision Screening:Left eye with correction: 20 / 50 Right eye with correction: 20 / 30 Both eyes with correction: 20 / 40        Vision Entered By: Shonna Chock CMA (June 28, 2010 11:26 AM)   CC:  CPX with fasting labs  and Type 2 diabetes mellitus follow-up.  History of Present Illness: Here for Medicare AWV:see Diagnoses; chart updated 1.Risk factors based on Past M, S, F history: 2.Physical Activities:stretching exercises , yard & garden work, walking 3 mpd  5X/ week  3.Depression/mood:no   4.Hearing: decreased to whisper @ 6 ft 5.ADL's: no limitations 6.Fall Risk: risk assessed by Dr Anne Hahn & meds Rxed 7.Home Safety: safety proofed 8.Height, weight, &visual acuity: see VS; last exam 04/2010, seen every 6 months by Dr Eulah Pont for Glaucoma 9.Counseling: POA & Living Will not in place 10.Labs ordered based on risk factors: see Orders 11.Referral Coordination: Audiology referrral discussed. Mammograms 2011 12. Care Plan: see Instructions 13. Cognitive Assessment: memory & recall excellent; "WORLD" speled backwards; mood & affect normal.    Hypertension Follow-Up:  She  reports urinary frequency, but denies lightheadedness, headaches, edema, and fatigue.  The patient denies the following associated symptoms: chest pain, chest pressure, exercise intolerance, dyspnea, palpitations, and syncope.  Compliance with medications (by patient report) has been near 100%.  Adjunctive measures currently used by the patient  include salt restriction.   BP well controlledby history. Type 2 Diabetes Mellitus Follow-Up: She  denies polydipsia, self managed hypoglycemia, weight loss, weight gain, and numbness of extremities.  The patient denies the following symptoms: neuropathic pain, poor wound healing, intermittent claudication, and foot ulcer.  Since the last visit the patient reports good dietary compliance and not monitoring blood glucose.    Preventive Screening-Counseling & Management  Alcohol-Tobacco     Alcohol drinks/day: 0     Smoking Status: never  Caffeine-Diet-Exercise     Caffeine use/day: 1 cup decaf; 1 glass decaf tea     Does Patient Exercise: yes  Hep-HIV-STD-Contraception     Dental Visit-last 6 months yes     Sun Exposure-Excessive: no  Safety-Violence-Falls     Seat Belt Use: yes     Smoke Detectors: no     Smoke Detector Counseling: discussed      Blood Transfusions:  after 2001.        Travel History:  [place] 2000.    Current Medications (verified): 1)  Boniva 150 Mg  Tabs (Ibandronate Sodium) .... Take 1 Monthly 2)  Amlodipine Besy-Benazepril Hcl 10-20 Mg  Caps (Amlodipine Besy-Benazepril Hcl) .Marland Kitchen.. 1 By Mouth Qd 3)  Metformin Hcl 500 Mg  Tabs (Metformin Hcl) .... **office Visit and Labs Due Now**1 By Mouth Two Times A Day With Largest Meals- 4)  Lumigan 0.03 %  Soln (Bimatoprost) 5)  Oscal Plus D 6)  Multivitamin 7)  Vit D400iu 8)  Prilosec  20 Mg  Cpdr (Omeprazole) .Marland Kitchen.. 1 By Mouth Once Daily 9)  Levetiracetam 500 Mg Tabs (Levetiracetam) .Marland Kitchen.. 1 By Mouth Two Times A Day 10)  Iron 325 (65 Fe) Mg Tabs (Ferrous Sulfate) .Marland Kitchen.. 1 By Mouth Once Daily  Allergies: 1)  ! 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  FIBROCYSTIC BREAST DISEASE, HX OF (ICD-V13.9) HIATAL HERNIA, HX  OF (ICD-V12.79) DIABETES MELLITUS (ICD-250.00) OSTEOARTHRITIS (ICD-715.90) HYPERTENSION (ICD-401.9) EXTERNAL HEMORRHOIDS (ICD-455.3) POLYP, COLON (ICD-211.3), Dr Sable Feil ESOPHAGUS (ICD-530.85) Pancreatitis, Biliary  2005 Hyperlipidemia: NMR Lipoprofile 2008: LDL 131( 1260/397), HDL 68, TG 82   Past Surgical History: Swallowed straight pin  removed from  intestines  @ age 48 complicated by peritonitis  , residual granulomatous changes Appendectomy Hysterectomy & BSO for dysfunctional menses, abnormal PAP (atypical), D&C X2 pre TAH, Dr  Hilbert Bible Cholecystectomy , Dr Abbey Chatters 2003 Scrofula -ectomy + radiation to neck  @ age 53  Colon polypectomy Tonsillectomy  L shoulder fracture  (1980) Wrist  fracture (1970) Back Surgery L5-S1, Dr Phoebe Perch 2003 Cyst removed -left neck (10/2002) Multiple EGDs : Barrett's, Dr Leone Payor  Family History: Father: MI  @ age 60,  died @ 74 Mother: DM, Osteoporosis, hip fracture., emphysema (non-smoker) Siblings: brother:Bone cancer , MI @ 72               brother: Emphysema               sister: Lung cancer Aunt:  Bone cancer  Social History: Retired Never Smoked Alcohol use-no Married Regular exercise-yes Caffeine use/day:  1 cup decaf; 1 glass decaf tea Dental Care w/in 6 mos.:  yes Sun Exposure-Excessive:  no Seat Belt Use:  yes Blood Transfusions:  after 2001 Does Patient Exercise:  yes  Physical Exam  General:  well-nourished;alert,appropriate and cooperative throughout examination Head:  Normocephalic and atraumatic without obvious abnormalities.  Eyes:  No corneal or conjunctival inflammation noted. EOMI. Perrla. Funduscopic exam benign, without hemorrhages, exudates or papilledema. Ears:  External ear exam shows no significant lesions or deformities.  Otoscopic examination reveals clear canals, tympanic membranes are intact bilaterally without bulging, retraction, inflammation or discharge. Hearing is grossly normal  bilaterally. Nose:  External nasal examination shows no deformity or inflammation. Nasal mucosa are pink and moist without lesions or exudates. Mouth:  Oral mucosa and oropharynx without lesions or exudates.  Teeth in good repair. Neck:  No deformities, masses, or tenderness noted. Lungs:  Normal respiratory effort, chest expands symmetrically. Lungs are clear to auscultation, no crackles or wheezes. Heart:  normal rate, regular rhythm, no gallop, no rub, no JVD, no HJR, and grade1  /6 systolic murmur.   Abdomen:  Bowel sounds positive,abdomen soft and non-tender without masses, organomegaly or hernias noted. Op scar  well healed Genitalia:  Dr Ambrose Mantle Msk:  Lordosis  & scoliosis Pulses:  R and L carotid,radial,dorsalis pedis and posterior tibial pulses are full and equal bilaterally Extremities:  No clubbing, cyanosis, edema . Marked  mixed arthritic  hand changes, RUE > LUE Neurologic:  alert & oriented X3 and DTRs symmetrical and normal.   Skin:  Epidermoid inclusion cyst L pre auricular area Cervical Nodes:  No lymphadenopathy noted Axillary Nodes:  No palpable lymphadenopathy Psych:  memory intact for recent and remote, normally interactive, and good eye contact.  Impression & Recommendations:  Problem # 1:  PREVENTIVE HEALTH CARE (ICD-V70.0)  Orders: Medicare -1st Annual Wellness Visit [phone])  Problem # 2:  HYPERTENSION, ESSENTIAL NOS (ICD-401.9)  Her updated medication list for this problem includes:    Amlodipine Besy-benazepril Hcl 10-20 Mg Caps (Amlodipine besy-benazepril hcl) .Marland Kitchen... 1 by mouth qd  Orders: TLB-Creatinine, Blood (82565-CREA) TLB-Potassium (K+) (84132-K) TLB-BUN (Urea Nitrogen) (84520-BUN) EKG w/ Interpretation (93000)  Problem # 3:  DIABETES MELLITUS (ICD-250.00)  Her updated medication list for this problem includes:    Amlodipine Besy-benazepril Hcl 10-20 Mg Caps (Amlodipine besy-benazepril hcl) .Marland Kitchen... 1 by mouth qd    Metformin Hcl 500 Mg Tabs  (Metformin hcl) .Marland Kitchen... **office visit and labs due now**1 by mouth two times a day with largest meals-  Problem # 4:  BARRETTS ESOPHAGUS (ICD-530.85) as per Dr Leone Payor Orders: TLB-CBC Platelet - w/Differential (85025-CBCD)  Problem # 5:  OSTEOPOROSIS (ICD-733.00) BMD done 2011 Her updated medication list for this problem includes:    Boniva 150 Mg Tabs (Ibandronate sodium) .Marland Kitchen... Take 1 monthly  Orders: T- * Misc. Laboratory test [phone])  Complete Medication List: 1)  Boniva 150 Mg Tabs (Ibandronate sodium) .... Take 1 monthly 2)  Amlodipine Besy-benazepril Hcl 10-20 Mg Caps (Amlodipine besy-benazepril hcl) .Marland Kitchen.. 1 by mouth qd 3)  Metformin Hcl 500 Mg Tabs (Metformin hcl) .... **office visit and labs due now**1 by mouth two times a day with largest meals- 4)  Lumigan 0.03 % Soln (Bimatoprost) 5)  Oscal Plus D  6)  Multivitamin  7)  Vit D400iu  8)  Prilosec 20 Mg Cpdr (Omeprazole) .Marland Kitchen.. 1 by mouth once daily 9)  Levetiracetam 500 Mg Tabs (Levetiracetam) .Marland Kitchen.. 1 by mouth two times a day 10)  Iron 325 (65 Fe) Mg Tabs (Ferrous sulfate) .Marland Kitchen.. 1 by mouth once daily  Other Orders: Venipuncture (09811) TLB-TSH (Thyroid Stimulating Hormone) (84443-TSH) TLB-A1C / Hgb A1C (Glycohemoglobin) (83036-A1C) TLB-Microalbumin/Creat Ratio, Urine (82043-MALB) TLB-Lipid Panel (80061-LIPID) TLB-Hepatic/Liver Function Pnl (80076-HEPATIC) Tdap => 49yrs IM (91478) Admin 1st Vaccine ([address])  Patient Instructions: 1)  Check your Blood Pressure regularly. If it is above:  140/90 ON AVERAGE you should make an appointment. [facility] after review of labs.Consider Health Care POA & Living Will as discussed.  2)  Schedule a colonoscopy & upper Endoscopy as per Dr Marvell Fuller recommendations. 3)  It is important that your Diabetic A1c level is checked every  6 months. 4)  Check your feet each night for sore areas, calluses or signs of infection.   Orders Added: 1)  Venipuncture [36415] 2)   TLB-TSH (Thyroid Stimulating Hormone) [84443-TSH] 3)  TLB-CBC Platelet - w/Differential [85025-CBCD] 4)  TLB-A1C / Hgb A1C (Glycohemoglobin) [83036-A1C] 5)  TLB-Microalbumin/Creat Ratio, Urine [82043-MALB] 6)  TLB-Creatinine, Blood [82565-CREA] 7)  TLB-Potassium (K+) [84132-K] 8)  TLB-BUN (Urea Nitrogen) [84520-BUN] 9)  TLB-Lipid Panel [80061-LIPID] 10)  TLB-Hepatic/Liver Function Pnl [80076-HEPATIC] 11)  T- * Misc. Laboratory test [phone] 12)  Medicare -1st Annual Wellness Visit [G0438] 13)  Est. Patient Level III [57846] 14)  EKG w/ Interpretation [93000] 15)  Tdap => 56yrs IM [90715] 16)  Admin 1st Vaccine [90471]   Immunizations Administered:  Tetanus Vaccine:    Vaccine Type: Tdap    Site: right deltoid    Mfr: GlaxoSmithKline    Dose: 0.5 ml    Route: IM    Given by: Shonna Chock CMA    Exp. Date: 02/23/2012    Lot #: [id number]  VIS given: 03/23/08 version given June 28, 2010.   Immunizations Administered:  Tetanus Vaccine:    Vaccine Type: Tdap    Site: right deltoid    Mfr: GlaxoSmithKline    Dose: 0.5 ml    Route: IM    Given by: Shonna Chock CMA    Exp. Date: 02/23/2012    Lot #: [id number]    VIS given: 03/23/08 version given June 28, 2010.

## 2010-07-12 NOTE — Procedures (Signed)
Summary: EGD Report/[facility]  EGD Report/[facility]   Imported By: Maryln Gottron 07/03/2010 15:36:23  _____________________________________________________________________  External Attachment:    Type:   Image     Comment:   External Document

## 2010-08-11 ENCOUNTER — Other Ambulatory Visit: Payer: Self-pay | Admitting: Internal Medicine

## 2010-09-03 ENCOUNTER — Other Ambulatory Visit: Payer: Self-pay

## 2010-09-03 MED ORDER — METFORMIN HCL 500 MG PO TABS: 500.0000 mg | ORAL_TABLET | Freq: Every day | ORAL | Status: DC

## 2010-09-05 ENCOUNTER — Ambulatory Visit (INDEPENDENT_AMBULATORY_CARE_PROVIDER_SITE_OTHER): Payer: Medicare Other | Admitting: Internal Medicine

## 2010-09-05 ENCOUNTER — Encounter: Payer: Self-pay | Admitting: Internal Medicine

## 2010-09-05 DIAGNOSIS — M25551 Pain in right hip: Secondary | ICD-10-CM

## 2010-09-05 DIAGNOSIS — M25559 Pain in unspecified hip: Secondary | ICD-10-CM

## 2010-09-05 DIAGNOSIS — M1990 Unspecified osteoarthritis, unspecified site: Secondary | ICD-10-CM

## 2010-09-05 DIAGNOSIS — M810 Age-related osteoporosis without current pathological fracture: Secondary | ICD-10-CM

## 2010-09-05 MED ORDER — TRAMADOL HCL 50 MG PO TABS: 50.0000 mg | ORAL_TABLET | Freq: Four times a day (QID) | ORAL | Status: DC | PRN

## 2010-09-05 NOTE — Progress Notes (Signed)
  Subjective:    Patient ID: Shelley Navarro, female    DOB: 1920/12/09, 77 y.o.   MRN: [id number]  HPI Hip Pain  Location: R posterior hip  Onset: 09/03/2010  Severity: scale 2-9 Pain is described as: sharp  Worse with: standing up & walking   Better with: Tylenol  Pain radiates to: no  Impaired range of motion: yes from pain; she has been unable to do her morning exercises  History of repetitive motion: garden work 04/28   History of trauma:  no   Past history of similar problem:  yes, several times ; it resolved after 3 days   Symptoms Back Pain:  no  Numbness/tingling:  no  Weakness:  no  Red Flags Fever:  no   Bowel/bladder dysfunction:  no      Review of Systems     Objective:   Physical Exam she is in no acute distress.  There is increased lordotic curve to the thoracic spine. There is no significant pain to percussion of the lumbosacral spine  The tendon reflexes are normal. Strength is good.  She has severe osteoarthritic changes of the hand.  She does limp slightly on the right with walking.  There is negative straight-leg raising bilaterally. The pain is exacerbated by rotation of the right hip with the knee flexed.  No skin lesions are present        Assessment & Plan:  #1 right hip pain; the differential diagnosis is bursitis related to the garden work versus a pyriformis  syndrome. Plan: #1 tramadol 50 mg every 6 hours as needed. If she does not improve then referral will be made to an orthopedist.

## 2010-09-05 NOTE — Patient Instructions (Signed)
Do not  do exercises until the pain has been resolved for at least 48 hours. Soak in a hot once or twice a day.

## 2010-09-18 NOTE — Assessment & Plan Note (Signed)
[facility]                         GASTROENTEROLOGY OFFICE NOTE   AVANA, KREISER                   MRN:          [id number]  DATE:02/17/2007                            DOB:          1921/01/28    REFERRING PHYSICIAN:  Titus Dubin. Hopper, MD,FACP,FCCP   CHIEF COMPLAINT:  Recent constipation, chronic occult blood loss anemia.   ASSESSMENT:  This is an 77 year old woman who has had a decline in her  hematocrit from 36% to 33%.  Recent Hemoccults are negative, she tells  me.  She has a history of Barrett's esophagus and a large hiatal hernia  that I believe has caused some chronic occult blood loss anemia.  I  think it has been the hiatal hernia.  Though she is 56, she is still  very active.  Her iron saturation is 8%.  Her MCV is 80.  I think she  could have a component of chronic disease anemia.   PLAN:  1. Recheck her CBC, check as a ferritin, as well as a B12 and folate.  2. I am not inclined to pursue another endoscopy or a colonoscopy      based on the findings today.  She is due for Barrett's esophagus      surveillance in January of 2009, given her age, despite her high      functional status, one has to consider whether or not it makes      sense to screen her for dysphagia, which she has never had.  The      therapies for that are quite toxic, and she would never be a      surgical candidate.  3. I will see what the results of these lab studies show, and we will      go from there.  She understands the plan.   HISTORY:  She feels fine without any dysphagia.  She had these lab  result changes as noted.  There is no melena or bleeding reported.  I  reviewed the labs and Dr. Frederik Pear office notes.  She had Hemoccults in  January, and then I think she had repeat Hemoccults that were negative.  She continues on omeprazole 20 mg b.i.d., taking it before breakfast and  lunch.  Heartburn is under control.   MEDICATIONS:  Listed and  reviewed in our chart.  Otherwise, she is also  on Boniva.   DRUG ALLERGIES:  SULFA.   PAST MEDICAL HISTORY:  Reviewed, and unchanged from problem list in the  chart, and previous notations.  Essentially unchanged from discharge of  April 2005.  She also has osteoporosis, and is on vitamin D therapy.   SOCIAL HISTORY:  She has never smoked.  She is active.  She still drives  a car.   REVIEW OF SYSTEMS:  She is eating well.  She has good energy.  She is  sleeping well.  She really feels overall very well at this time.  No  urinary problems.   ADDITIONAL MEDICAL HISTORY:  She did have radiation to the neck at age 39  because of scopula.  I reviewed the problem list  in the [facility] EMR  as well.   PHYSICAL EXAMINATION:  Reveals a relatively spry 77 year old white  woman.  Weight 116 pounds.  Pulse 88.  Blood pressure 120/60.  She has fairly marked kyphosis.  LUNGS:  Clear.  NECK:  Supple.  HEART:  S1 and S2.  I hear no rubs, murmurs, or gallops.  ABDOMEN:  Soft and nontender without organomegaly or mass.  LOWER EXTREMITIES:  Free of edema.  She is alert and oriented x3.   I appreciate the opportunity to care for this patient.     Iva Boop, MD,FACG  Electronically Signed    CEG/MedQ  DD: 02/17/2007  DT: 02/18/2007  Job #: [id number]   cc:   Titus Dubin. Alwyn Ren, MD,FACP,FCCP

## 2010-09-18 NOTE — Assessment & Plan Note (Signed)
[facility]                         GASTROENTEROLOGY OFFICE NOTE   Shelley Navarro, Shelley Navarro                 MRN:          [id number]  DATE:06/23/2007                            DOB:          11-Mar-1921    CHIEF COMPLAINT:  Iron deficiency anemia.   Shelley Navarro has an iron deficiency anemia.  We have been considering a  repeat colonoscopy. It has been five years since she had one.  She had a  little spell of constipation and then some diarrhea a few months ago,  but bowel movements have normalized.  Her hemoglobin is now normal,  though her ferritin was still only 11 in January.   PAST MEDICAL HISTORY:  1. Large hiatus hernia.  No obvious Cameron's erosions on last EGD      October 2008.  2. Barrett's esophagus.  3. Last colonoscopy December 2003.  External hemorrhoids.  Otherwise      unremarkable.  (That was a screening procedure).  4. Vitamin D therapy.  5. Osteoporosis.  6. Sulfa allergy.  7. 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  8. Gastric ulcer, history of.  9. Prior hysterectomy.  10.Prior appendectomy.  11.Prior minilaparotomy with foreign body removal.  12.Osteoarthritis.  13.Hypertension.  14.Laparoscopic cholecystectomy and biliary pancreatitis April of      2005.   MEDICATIONS:  Listed and reviewed in the chart today.   ALLERGIES:  Reviewed and updated.  SHE IS ALLERGIC TO SULFA.   REVIEW OF SYSTEMS:  As above.   PHYSICAL EXAMINATION:  Weight 112 pounds.  Height 4 feet 10 inches.  Pulse 60, blood pressure 138/62.   ASSESSMENT:  Recurrent iron deficiency anemia of unclear cause at this  time.  Could be from her hiatal hernia.  I have not seen Cameron's  erosions, but sometimes they are elusive.   PLAN:  Since it has been five years since she has had a colonoscopy, in  fact longer now, we are going to proceed with one of those to exclude  any colonic blood loss.  She understands the risks, benefits and  indications.   Pediatric colonoscope will be used.  Further plans pending  that.  If that is unrevealing, would likely discontinue to supplement  with iron chronically.   Can consider celiac testing as well.  Though that would give [place] an  answer, I am not sure I would place her on a celiac diet at this age.     Shelley Boop, MD,FACG  Electronically Signed    CEG/MedQ  DD: 06/23/2007  DT: 06/23/2007  Job #: [id number]   cc:   Shelley Navarro. Shelley Ren, MD,FACP,FCCP  Shelley Navarro. Shelley Navarro, M.D.

## 2010-09-18 NOTE — Assessment & Plan Note (Signed)
[facility]                         GASTROENTEROLOGY OFFICE NOTE   SHARDA, KEDDY                 MRN:          [id number]  DATE:03/24/2007                            DOB:          1921-04-25    CHIEF COMPLAINT:  Follow up of iron deficiency anemia.   HISTORY OF PRESENT ILLNESS:  Repeat EGD showed a large hiatal hernia.  I  could not see Cameron's erosions.  She has a persistent Barrett's  esophagus because of her age.  I did not think biopsying that for  surveillance made since.  She has had some mild constipations, turns out  that was rare.  She is on iron 325 b.i.d.  See note of February 17, 2007,  for further details of past medical history.   MEDICATIONS:  Listed and reviewed in the chart.   ALLERGIES:  SULFA.   PHYSICAL EXAMINATION:  VITAL SIGNS:  Height 4 feet 10 inches, weight 111  pounds, pulse 80, blood pressure 130/60.   ASSESSMENT:  Chronic occult blood loss anemia.  It is probably from her  hiatal hernia.  Her last colonoscopy was five years ago.   I have recommended another colonoscopy, though she is reticent to do so.  She understands the risks of missed malignancy.  I think that is  unlikely, but it remains possible.  Another thought that I had after she  left the office is if she has never been checked for celiac disease we  can consider that.  I will see what her iron studies show on followup.  She will let us know if she wants to pursue a colonoscopy.  Consider the  celiac disease antibodies.     Iva Boop, MD,FACG  Electronically Signed    CEG/MedQ  DD: 03/24/2007  DT: 03/25/2007  Job #: [id number]   cc:   Titus Dubin. Alwyn Ren, MD,FACP,FCCP

## 2010-09-21 NOTE — Op Note (Signed)
[name], [name]                      ACCOUNT NO.:  [id number]   MEDICAL RECORD NO.:  [id number]                   PATIENT TYPE:   LOCATION:                                       FACILITY:   PHYSICIAN:  Lina Sar, MD [facility]                 DATE OF BIRTH:  Oct 25, 1920   DATE OF PROCEDURE:  02/19/2002  DATE OF DISCHARGE:                                 OPERATIVE REPORT   DATE OF BIRTH:  10/03/1920   PROCEDURE:  Upper endoscopy.   INDICATIONS FOR PROCEDURE:  This 77 year old white female presented to Dr.  Frederik Pear office yesterday with weakness and hemoglobin of 6 grams. She  reported black stools for the past three weeks since surgery on her back.  Denies the use of NSAIDS or aspirin. We saw her in the past for screening  flexible sigmoidoscopy in 1995. Her hemoglobin was normal two years ago.  Denies an upper GI symptoms. She is undergoing upper endoscopy after being  transfused with two units of packed cells and hemoglobin rising to 8.1  grams. Her stools has been heme positive.   ENDOSCOPIC SEDATION:  1. Versed 5 mg IV.  2. Demerol 30 mg IV.   FINDINGS:  Olympus video endoscopic was passed under direct vision into the  posterior pharynx and into the esophagus. Oxygen saturation was  satisfactory. She was very cooperative. Proximal and mid esophageal mucosa  was normal. At the level of 20 cm from the incisors was the squamocolumnar  junction which was consistent with Barrett's esophagus. There was at least  three discrete erosions at the esophageal site of the GE junction consistent  with grade II reflux esophagitis. Multiple biopsies were taken from the GE  junction to confirm Barrett's epithelium. Distal esophagus showed gastric  mucosa all the way to 27 cm where there were three large ulcerations at the  lower esophageal sphincter at the level of 27 cm from the incisors. One  measured about 1 cm, one 8 mm, and one 5 mm. They appeared benign. Edges of  the  ulcers were benign but friable and there was a little edema surrounding  the tissues. There was contact bleeding. Multiple biopsies were taken from  this area to rule out malignant ulcers. There was no stricture. Stomach:  There was a simple 5 cm hiatal hernia which extended from 27 cm to 32 cm  from the incisors. Some of the gastric ulcers were extending into the hiatal  hernia. Gastric folds were normal. Pyloric outlet and antrum were  unremarkable. Retroflexion of the endoscope revealed no evidence of a mass  in the fundus or cardia but the ulcerations were noted from the retroflex  view. Duodenum, duodenal bulb, and the ascending duodenum were normal. The  endoscope was then brought back through the stomach and CLO test was taken  from the gastric antrum. The patient tolerated the procedure well.   IMPRESSION:  1.  Multiple gastric ulcers at the hiatal hernia, status post biopsies and     CLO test.  2. Barrett's esophagus at 20 cm with grade II esophagitis, status post     multiple biopsies.    PLAN:  The multiple ulcerations appear endoscopically benign but will await  results of the biopsies to rule out  dysplasia. She will be transfused two  more units of packed cells, continue Protonix, and bowel rest. She will also  need supplements. She will undoubtedly need repeat upper endoscopy in six to  eight weeks to ensure complete healing of her gastric ulcers.                                               Lina Sar, MD [facility]    DB/MEDQ  D:  02/19/2002  T:  02/19/2002  Job:  [id number]   cc:   Titus Dubin. Alwyn Ren, M.D. [facility]

## 2010-09-21 NOTE — Discharge Summary (Signed)
NAME:  Shelley Navarro, Shelley Navarro                    ACCOUNT NO.:  [id number]   MEDICAL RECORD NO.:  [id number]                   PATIENT TYPE:  INP   LOCATION:  5015                                 FACILITY:  [facility]   PHYSICIAN:  Malcolm T. Russella Dar, M.D. [facility]          DATE OF BIRTH:  05-03-1921   DATE OF ADMISSION:  08/21/2003  DATE OF DISCHARGE:  08/24/2003                                 DISCHARGE SUMMARY   ADMISSION DIAGNOSES:  1. Biliary pancreatitis, mild with symptoms of history of upper abdominal     pain.  2. History of gastroesophageal reflux disease, hiatal hernia, and Barrett's     esophagus.  3. Hypertension.  4. Osteoporosis.  5. Osteoarthritis.  6. Status post hysterectomy, appendectomy, mini laparotomy with removal of     foreign body remotely.  7. History of esophageal ulcers with associated gastrointestinal blood loss     and associated anemia.   DISCHARGE DIAGNOSES:  1. Mild biliary pancreatitis, resolved.  2. Gallstones.  3. Status post laparoscopic cholecystectomy with intraoperative     cholangiogram.  4. Mild hyperglycemia.   BRIEF HISTORY:  This is a very pleasant 77 year old white female who is in  good health for her age and quite active.  She has been followed by Dr.  Leone Payor for history of ulcerative esophagitis leading to anemia requiring  hospitalization in November 2003.  She had followup upper endoscopy in the  fall of 2004.  She takes chronic proton pump inhibitors and is generally  asymptomatic from a GI standpoint.  However on the day of admission, she  developer upper abdominal pain radiating into the back.  It was severe, and  she was transported by EMS to the emergency department.  Her symptoms abated  by the time she was seen by Dr. Leone Payor.  She did not have any nausea or  vomiting.  Labs demonstrated lipase of 1465 and elevated transaminase,  normal alkaline phosphatase and bilirubin.  She had not been taking any new  medications.  She only had  some mild constipation.  Ultrasound of the  abdomen demonstrated small gallstones but normal diameter of the  intrahepatic and extrahepatic biliary ducts.  There was some small amount of  free fluid around the liver and some incidental bilateral renal atrophy on  the ultrasounds.   The patient was admitted by Dr. Leone Payor for management of biliary  pancreatitis.   LABORATORY AND X-RAY DATA:  White blood cell count initially 11.4,  normalized to 5.1.  Hemoglobin 11.1, hematocrit 33, MCV 81.6, platelets  277,000.  Sodium 142, potassium 3.6.  Glucose 114 to 119.  BUN 18,  creatinine 1.0.  Total bilirubin 0.6, alkaline phosphatase 97. SGOT went  from 404 down to 37.  SGPT went from 129 down to 68.  Amylase 171 down to  99.  Lipase 1465 down to 65.  CK 116, CK-MB 4.7, troponin I 0.01.   Ultrasound as above in History of Present Illness.  Single-view portable chest showed cardiomegaly, changes of bronchitis,  hiatal hernia, and calcified granuloma in the left base.   Intraoperative cholangiogram: Normal caliber of intrahepatic and  extrahepatic ducts and no filling defects.   HOSPITAL COURSE:  #1.  BILIARY PANCREATITIS:  Biliary pancreatitis was quite a mild case.  By  the time she was seen in the emergency room by Dr. Leone Payor, her pain had  abated.  She never had any nausea or vomiting.  She never had recurrence of  the severe abdominal pain which had led her to the hospital in the first  place.  Lipase and amylase rapidly normalized.  No evidence for retained  stones in the common bile duct on the intraoperative cholangiogram;  therefore, she did not require ERCP.   #2.  GALLSTONES:  The patient had gallstones on the ultrasound.  Because  these had become symptomatic interns of developing the biliary pancreatitis,  Dr. Abbey Chatters elected to perform laparoscopic cholecystectomy. Surgery went  without problems.  She was discharged on postop day #1 in stable and  satisfactory condition.   She had little in the way of narcotic requirement  post surgically.  She was tolerating clears but had yet to be advanced to  solid foods which would be done as an outpatient.   #3.  HISTORY OF ULCERATIVE ESOPHAGITIS:  The patient is to continue on  chronic proton pump inhibitor therapy.   #4.  MILD HYPERGLYCEMIA:  Note that the blood sugars were mildly elevated in  the setting of biliary pancreatitis.  Blood sugars can be followed from time  to time by her primary care doctor to assure that she does not develop full-  blown medication-requiring diabetes.   DISCHARGE MEDICATIONS:  1. Lotrel 2.5/10 mg daily.  Note that this is the presumptive dose of this     medication; she may actually be taking a higher dose.  She did not known     the dose when she came in.  2. Os-Cal twice daily.  3. Protonix 40 mg daily.  4. Actonel 35 mg weekly.  5. Darvocet-N 100 one p.o. q.4-6h. p.r.n. pain control.   FOLLOW UP:  1. The patient has been provided with Dr. Maris Berger office number, and     she will call to make an appointment for about two weeks from today.  2. The patient does not need to see Dr. Leone Payor back in the office unless     Dr. Abbey Chatters feels it is necessary.  She is on a routine of upper     endoscopies with Dr. Leone Payor, but she will not need another upper     endoscopy for at least a year or so.   WOUND CARE:  She has been instructed to remove the bandages in a couple of  days, and the paper strips can be removed in a week; or if they fall off,  this is fine.   Preprinted wound care, diet, and general postop parameters/instructions have  been provided to the patient.      Jennye Moccasin, P.A. [facility]                   Malcolm T. Russella Dar, M.D. [facility]    SG/MEDQ  D:  08/24/2003  T:  08/25/2003  Job:  [id number]   cc:   Adolph Pollack, M.D.  [address], [address]  [place]  [place] [address]  Fax: [phone]   Iva Boop, M.D. [facility]

## 2010-09-21 NOTE — Consult Note (Signed)
NAME:  Shelley Navarro, Shelley Navarro                    ACCOUNT NO.:  [id number]   MEDICAL RECORD NO.:  [id number]                   PATIENT TYPE:  INP   LOCATION:  5015                                 FACILITY:  [facility]   PHYSICIAN:  Adolph Pollack, M.D.            DATE OF BIRTH:  [birth year]   DATE OF CONSULTATION:  08/22/2003  DATE OF DISCHARGE:                                   CONSULTATION   REQUESTING PHYSICIAN:  Dr. Stan Head.   REASON FOR CONSULTATION:  Biliary pancreatitis.   HISTORY OF PRESENT ILLNESS:  Shelley Navarro is an 77 year old female, fairly  healthy, who had an episode of abdominal pain beginning in the upper abdomen  and radiating around to her back that was quite severe and did not relent.  It was more like a tightness. She had no nausea or vomiting. No fever or  chills. She presented to the emergency department early the morning of August 21, 2003, and the pain eased off at that time; however, she is noted to have  substantially elevated liver enzymes as well as the lipase over 1,000. She  subsequently was admitted. At that time, abdominal ultrasound was also  performed demonstrating multiple gallstones that are small. There is no  ductal dilatation. No pericholecystic fluid. Pancreas is not well seen due  to overlying bowel gas. Currently this morning, she is pain free but does  not have much of an appetite.   PAST MEDICAL HISTORY:  1. Hypertension.  2. Hiatal hernia.  3. Barrett's esophagus.  4. Esophageal ulcers secondary to her esophagitis.  5. Osteoarthritis.   PAST SURGICAL HISTORY:  1. Exploratory laparotomy for peritonitis as she swallowed a pin as a child     and performed her intestines.  2. Appendectomy for ruptured appendix.  3. Hysterectomy.  4. Breast biopsy.  5. She had L5-S1 back surgery by Dr. Phoebe Perch back in 2003.   DRUG ALLERGIES:  SULFA.   HOME MEDICATIONS:  1. Actonel.  2. Lotrel.  3. Protonix.  4. Os-Cal.  5. Vitamin D.   SOCIAL  HISTORY:  She denies any tobacco use. No alcohol use.   REVIEW OF SYSTEMS:  CARDIOVASCULAR:  She says she has had a long history of  a bundle branch block. She has no chest pain and no known coronary artery  disease. PULMONARY:  She states she has no asthma, pneumonia, emphysema.  GASTROINTESTINAL:  She states she had yellow jaundice as a child. She has no  melena or hematochezia.   PHYSICAL EXAMINATION:  GENERAL:  An elderly female in no acute distress. She  is very pleasant and cooperative.  VITAL SIGNS:  Temperature is 97.9, blood pressure 115/38, pulse 70, room  saturations 97%.  HEENT:  Eyes:  Extraocular motions intact. No icterus noted.  NECK:  Her neck is supple without palpable masses or obvious thyroid  enlargement.  CARDIOVASCULAR:  Heart demonstrates a regular rate and rhythm. No murmur  appreciated.  No JVD.  RESPIRATORY:  Breath sounds were equal and clear. Respirations are  unlabored.  ABDOMEN:  Soft. There is a left upper paramedian scar present. There is also  a lower midline scar and a lower transfer scar in the abdomen noted. No  obvious hernias. She had mild tenderness in the right upper quadrant region.  No palpable masses or organomegaly.   LABORATORY DATA:  This morning, her hemoglobin 11.1, white blood cell count  5,100. SGOT and SGPT have come down but are still slightly elevated at 85  and 98. Amylase is 171, lipase is now 118.   IMPRESSION:  1. Biliary pancreatitis. Appears to clinically and chemically resolving. She     is being started on a clear liquid diet today.  2. Hypertension, well controlled.  3. Hiatal hernia with Barrett's esophagus.   PLAN:  We discussed the recommended management of this which is a  cholecystectomy, laparoscopic possible open technique. We did go over the  procedure rationale and risks. The risks include but are not limited to  bleeding, infection, bile duct injury, hepatitic injury and bile leak, small  intestinal injury,  risks of general anesthesia, and possible post  cholecystectomy diarrhea. She seems to understand this, and she would agree  to proceed with the cholecystectomy. I told her that we would try her on a  clear liquid diet to make sure she did not have recurrent of her symptoms,  and if she did not and if the operating room had available time tomorrow,  possibly we could schedule operation for tomorrow. If not, then potentially  she could be discharged for a short time and brought back for a  cholecystectomy in the near future. She is in agreement with the plan.                                               Adolph Pollack, M.D.    Kari Baars  D:  08/22/2003  T:  08/23/2003  Job:  [id number]   cc:   Titus Dubin. Alwyn Ren, M.D. [facility]

## 2010-09-21 NOTE — Op Note (Signed)
NAME:  Shelley Navarro, Shelley Navarro                    ACCOUNT NO.:  [id number]   MEDICAL RECORD NO.:  [id number]                   PATIENT TYPE:  INP   LOCATION:  2899                                 FACILITY:  [facility]   PHYSICIAN:  Clydene Fake, M.D.               DATE OF BIRTH:  1920-08-16   DATE OF PROCEDURE:  01/21/2002  DATE OF DISCHARGE:                                 OPERATIVE REPORT   PREOPERATIVE DIAGNOSES:  Left L5-S1 foraminal stenosis due to spondylosis  and probable large osteophytes.   POSTOPERATIVE DIAGNOSES:  Left L5 radiculopathy due to compression of the L5  root in the foramen due to cystic mass in the foramen, possibly from facet,  possible disk.   PROCEDURES:  Left L5-S1 semihemilaminectomy, partial facetectomy, and  resection of intraforaminal mass, microdissection with microscope.   SURGEON:  Clydene Fake, M.D.   ASSISTANT:  Mena Goes. Franky Macho, M.D.   ANESTHESIA:  General endotracheal tube anesthesia.   ESTIMATED BLOOD LOSS:  Minimal.   BLOOD REPLACED:  None.   DRAINS:  None.   COMPLICATIONS:  None.   PATHOLOGY:  Cystic mass sent to pathology for permanent section, and the  contents were cultured for routine culture, and Gram stain also performed.  Results all pending.   INDICATION FOR PROCEDURE:  The patient is a 77 year old woman who for six  months has had left back and left hip pain that has kind of come and gone,  but starting in mid-August started having severe pain radiating down her  left leg, going to the top of her foot, with some numbness and weakness in  that leg.  She has tried a steroid dose pack and nonsteroidal anti-  inflammatories, and this has not helped.  She has 4/5 strength in left  dorsiflexion and extensor hallucis longus with positive straight leg raise  on the left and L5 root.  MRI showed a mass in the L5-S1 foramen or possibly  a calcification from the facet with impingement of the 5 root there.  Patient brought in for  decompression of the L5 root and opening up of the  foramen.   DESCRIPTION OF PROCEDURE:  The patient was brought in the operating room,  and general anesthesia was induced.  The patient was placed in the prone  position on the Wilson frame with all pressure points padded.  The patient  was prepped and draped in a sterile fashion, and the site of incision was  injected with 10 cc of 1% lidocaine with epinephrine.  A needle was placed  in the interspace.  X-ray was obtained showing this was the 5-1 interspace.  The incision was centered over the area.  A needle was placed, and the  incision was taken down to the fascia with hemostasis obtained with Bovie  cauterization.  The fascia was incised on the left side and subperiosteal  dissection was done over the L5-S1 spinous processes and laminae  out to the  facet.  We dissected out to the lateral aspect of the facet, placed a self-  retaining retractor.  A marker was placed at the interspace.  Again x-ray  done confirming this was the 5-1 level.  The microscope was brought in for  microdissection at this point.  A high-speed drill was then used to perform  a semihemilaminectomy and medial facetectomy for two-thirds of the facet,  and Kerrison punches were used to continue the laminectomy and facetectomy.  The dura and S1 root were seen, and then a very compressed L5 root was seen,  which was pushed medially and inferiorly.  As we were removing the facet, we  did get some dark, turbid fluid coming out from the foramen, and this was  cultured, results pending.  We continued the dissection lateral to the  compressed L5 root.  There was a large cystic mass.  We carefully dissected  this mass out.  We were unable to remove this mass in toto, so we opened up  the capsule wall and a type of caseous substance and sent it off for  pathology along with some of the cyst wall.  The cyst was then removed.  We  could see this off the path and then we used  foraminal Kerrison punches and  bit off the anterior aspect of the facet, opening up the foramen further.  When we were finished, we had good decompression of the 5 root.  That root  was loosely in place, a hockey stick out adjacent to it showing there was no  compression of the 5 root at this point.  We looked around for any more  cystic components and could not find any.  The wound was irrigated with  antibiotic solution.  We checked the S1 root, which was well-decompressed.  Gelfoam and thrombin was placed.  After hemostasis, this was then irrigated  out and some Depo-Medrol was placed over the 5 root, which was placed on a  couple of dry pieces of Gelfoam.  The retractors were removed.  The fascia  was closed with 0 Vicryl interrupted sutures, the subcutaneous tissue was  closed with 2-0 and 3-0 Vicryl interrupted sutures, and the skin closed with  Benzoin and Steri-Strips.  A dressing was placed.  The patient was placed  back in the supine position, awoken from anesthesia, and transferred to the  recovery room in stable condition.                                               Clydene Fake, M.D.    JRH/MEDQ  D:  01/21/2002  T:  01/23/2002  Job:  [id number]

## 2010-09-21 NOTE — H&P (Signed)
NAME:  Shelley Navarro, Shelley Navarro                    ACCOUNT NO.:  [id number]   MEDICAL RECORD NO.:  [id number]                   PATIENT TYPE:  INP   LOCATION:  1828                                 FACILITY:  [facility]   PHYSICIAN:  Iva Boop, M.D. [facility]           DATE OF BIRTH:  11/25/1920   DATE OF ADMISSION:  08/21/2003  DATE OF DISCHARGE:                                HISTORY & PHYSICAL   CHIEF COMPLAINT:  Abdominal pain.   HISTORY:  This is an 77 year old white woman with hypertension, Barrett's  esophagus, prior problems with anemia, and ulcerated esophagus in October of  2003. I follow her in gastroenterology. Dr. Alwyn Ren is her primary care  physician. She was in her usual state of health without any particular  problems until about two months ago when she had some mild upper abdominal  pain lasting for about two hours that resolved. No nausea or vomiting. Then  at about 2 a.m. today, she arose to urinate, has severe band-like upper  abdominal pain radiating across the upper abdominal area, constant without  nausea or vomiting. There may have been some radiation to the back. It did  not relent, so she called 911 and presented to the emergency department.  Over time, the symptoms abated with an hour or so of coming here, and she  has no symptoms at this time. She was evaluated with an ultrasound of the  abdomen which shows cholelithiasis and normal gallbladder wall and a normal  caliber common bile duct. Her laboratory investigation demonstrated a lipase  of 1,465, an AST of 404, an ALT at 179, normal alkaline phosphatase, and  bilirubin of 0.7. She has not been having any dysphagia or heartburn, taking  her Protonix regularly for her gastroesophageal reflux disease and Barrett's  esophagus. She has been mildly constipated lately. She did note that  yesterday she did not have much of an appetite before all of these started.  No fevers reported.   PAST MEDICAL HISTORY:  As above.  She has also had a hysterectomy,  appendectomy, and exploratory laparotomy with a minilaparotomy and removal  of a straight pin that she swallowed as a child many years ago. She also has  osteoporosis.   MEDICATIONS:  1. Protonix 40 mg q.d.  2. Actonel every week.  3. Lotrel q.h.s., dose not entirely clear.  4. Os-Cal b.i.d.   DRUG ALLERGIES:  SULFA has caused hallucinations.   SOCIAL HISTORY:  She lives with her husband. She is very active. Walks four  to eight miles a day. Still drives. No tobacco or alcohol.   FAMILY HISTORY:  Notable for coronary artery disease.   REVIEW OF SYSTEMS:  She has some nocturia but no dysuria or urinary  incontinence. She does have severe osteoarthritis with deformities of the  hands but is doing well from a pain standpoint she says. She wears some eye  glasses. There has been no upper respiratory symptoms. She has  no other  ongoing problems, and all other review of systems are negative at this time.   PHYSICAL EXAMINATION:  GENERAL:  Reveals a pleasant, elderly white woman in  no acute distress.  VITAL SIGNS:  Show temperature 98.8, blood pressure 118/72, pulse 82,  respirations 18.  HEENT:  Eyes are anicteric. Mouth is free of lesions. Posterior pharynx  clear.  NECK:  Supple. No mass or thyromegaly.  CHEST:  Clear.  HEART:  S1 and S2. No murmurs, rubs, or gallops.  ABDOMEN:  Shows her remote laparotomy scar in the left upper quadrant that  is soft and nontender. Bowel sounds are present. There is no organomegaly or  mass at this time.  EXTREMITIES:  Without clubbing, cyanosis, or edema.  MUSCULOSKELETAL:  She has severe osteoarthritic deformities of the digits of  the hands.  LYMPH NODES:  No neck or supraclavicular nodes.  SKIN:  Without rash.  NEUROLOGICAL:  She is alert and oriented x3. Has a nonfocal exam.   LABORATORY DATA:  As described above. White count is 11.4, hemoglobin 12,  hematocrit 35.8, MCV is normal, potassium 3.6, BUN  18, creatinine 1.0. Her  troponin was negative. EKG shows right bundle branch block without ischemic  changes.   Abdominal ultrasound was self-viewed by me; findings as characterized above.  Chest x-ray without acute disease. Remainder of her electrolytes were normal  on the CMET. CPK enzymes had a positive index but a normal total. These were  negative overall.   ASSESSMENT:  1. Biliary pancreatitis which is resolving at this time it seems. She is     asymptomatic at any rate. There is no good evidence for persistent     choledocholithiasis based upon the ultrasound and the laboratory     investigation.  2. Gastroesophageal reflux disease with hiatal hernia and Barrett's     esophagus.  3. Hypertension.  4. Osteoporosis.  5. Osteoarthritis.  6. Status post hysterectomy, appendectomy, and minilaparotomy with removal     of foreign body many years ago.  7. History of esophageal ulcers that have resolved which lead to anemia.   PLAN:  Admit, hydrate, pain control. Serial labs. Surgical consultation will  be undertaken. At this point, it seems like laparoscopic cholecystectomy  with intraoperative cholangiogram would be reasonable. Obviously, if her  bilirubin rises and fails to fall, preoperative ERCP could be indicated. I  have explained all of this to the patient. She understands.   I appreciate the opportunity to care for this patient.                                                Iva Boop, M.D. [facility]    CEG/MEDQ  D:  08/21/2003  T:  08/22/2003  Job:  [id number]

## 2010-09-21 NOTE — Discharge Summary (Signed)
NAME:  Shelley Navarro, Shelley Navarro                    ACCOUNT NO.:  [id number]   MEDICAL RECORD NO.:  [id number]                   PATIENT TYPE:  INP   LOCATION:  0478                                 [facility]ACILITY[facility]   PHYSICIAN:  Lina Sar, M.D. [facility]               DATE OF BIRTH:  1920/05/28   DATE OF ADMISSION:  02/18/2002  DATE OF DISCHARGE:  02/21/2002                                 DISCHARGE SUMMARY   ADMISSION DIAGNOSES:  55. A 77 year old white female with severe symptomatic anemia.  2. Abnormal esophagogastric junction on CT scan and recent upper GI     suggestive of benign ulceration versus malignant ulcer versus other     problems with complicated hiatal hernia, i.e. paraesophageal hernia.  3. Melena by history, resolved.  4. Constipation.  5. Status post hysterectomy, appendectomy and remote exploratory laparotomy.  6. Osteoarthritis and osteoporosis.  7. Hypertension.   DISCHARGE DIAGNOSES:  1. Severe symptomatic anemia, improved post transfusion with discharge     hemoglobin of 11.2.  2. Large ulcerations in hiatal hernia, biopsies benign.  3. Questionable Barrett's esophagus with negative biopsies.  4. Grade 2 reflux esophagitis.  5. Iron deficiency.  6. Other diagnoses as listed above.   CONSULTATIONS:  None.   PROCEDURE:  Upper endoscopy with biopsies per Dr. Lina Sar and  transfusion.   BRIEF HISTORY:  The patient is a pleasant 77 year old white female known to  Dr. Alwyn Ren, who had recently been seen for dyspnea on exertion.  She had  denied any chest pain or orthopnea, or other symptoms suggestive of heart  failure.  Labs were drawn and showed a hemoglobin of 6.7 on the day prior to  admission.  Repeat hemoglobin on the day of admission is 7.  MCV is 82,  platelets 641, WBC normal, stools were heme-negative within the past couple  of days, per the patient.  At this time she is seen and evaluated by GI for  urgent evaluation and did note black stools several  weeks prior to her  presentation, but says her stool has been looking normal recently.  She did  have a procedure done on her back about one month ago, but has not been on  any aspirin or NSAIDs.  She has no complaints of abdominal pain, has had  some mild nausea, no complaint of dysphagia or odynophagia.  She has been on  Fosamax long term for osteoporosis which has just been stopped.  The patient  underwent a CT scan of the chest and upper GI on 02/17/02, which suggests an  irregularity and ulceration of the distal esophagus and a large hiatal  hernia, cannot rule out a malignancy in the proximal stomach.  There is no  clear finding of paraesophageal hernia noted, although this is in the  differential.  At this time the patient was seen and evaluated by Dr.  Leone Payor in the office and admitted to the hospital for  transfusions and then  upper endoscopy for further diagnostic evaluation.   LABORATORY STUDIES:  On admission 02/18/02, WBC 5.2, hemoglobin 6.4,  hematocrit 19.4, MCV 81.2, platelets 576.  Serial values were obtained on  02/19/02, post transfusion hemoglobin was 8.1, hematocrit 24.8, and post two  more units on 02/21/02 hemoglobin up to 11.2, hematocrit 33.7.  Protime  12.8, INR 0.9, PTT of 30, electrolytes within normal limits, BUN 14,  creatinine 0.9, albumin 3.3.  Liver function studies normal.  Serum iron low  at 11, TIBC of 363 and iron saturation of 3, folate greater than 20.  CEA 2.   HOSPITAL COURSE:  The patient was admitted to the service of Dr. Stan Head, from the office.  She was initially transfused two units of packed  red blood cells and covered with IV Protonix.  She was scheduled for upper  endoscopy with Dr. Lina Sar the following morning.  This showed evidence  of three fairly large ulcerations in her hiatal hernia, multiple biopsies  were taken, she had a long segment of what appeared to be Barrett's  esophagus of the distal esophagus and grade 2  esophagitis, all of which was  biopsied.  Biopsies that were not back by the time of discharge, but have  returned showing chronic active gastritis with intestinal metaplasia, no H.  Pylori, dysphasia, or malignancy from the ulcer biopsies, and esophageal  biopsies show squamous mucosa with fiber purulent exudate consistent with  ulceration, no intestinal metaplasia, dysplasia or malignancy identified.   DISPOSITION:  After initial two unit transfusion the patient's hemoglobin  had come up to 8.1.  She was transfused another two units due to age and  symptoms with good response and hemoglobin up to 11.2.  The patient was  observed for 24 hours posttransfusion with stable hemoglobin and was allowed  discharge on the morning of 02/21/02, with instructions to continue Protonix  40 mg p.o. q.d., avoid all aspirin and NSAID's and to continue her other  medications including Flexeril, Lotrel, Os-Cal, Centrum Silver and  hydrocodone p.r.n. as prior to admission.  Her Fosamax had been discontinued  and she should stay off of this.  Diet is regular.   FOLLOW UP:  The patient was asked to make an appointment to see Dr. Leone Payor  in the office and would plan repeat endoscopy in 4-6 weeks.   DISCHARGE CONDITION:  Stable and improved.     Mike Gip, P.A.-C. [facility]                Lina Sar, M.D. [facility]    AE/MEDQ  D:  03/10/2002  T:  03/10/2002  Job:  [id number]   cc:   Titus Dubin. Alwyn Ren, M.D. [facility]

## 2010-09-21 NOTE — Op Note (Signed)
NAME:  Shelley Navarro, Shelley Navarro                    ACCOUNT NO.:  [id number]   MEDICAL RECORD NO.:  [id number]                   PATIENT TYPE:  INP   LOCATION:  5015                                 FACILITY:  [facility]   PHYSICIAN:  Adolph Pollack, M.D.            DATE OF BIRTH:  10-06-1920   DATE OF PROCEDURE:  08/21/2003  DATE OF DISCHARGE:                                 OPERATIVE REPORT   PREOPERATIVE DIAGNOSIS:  Biliary pancreatitis.   POSTOPERATIVE DIAGNOSIS:  Biliary pancreatitis.   OPERATION PERFORMED:  Laparoscopic cholecystectomy with intraoperative  cholangiogram.   SURGEON:  Adolph Pollack, M.D.   ASSISTANT:  Rose Phi. Maple Hudson, M.D.   ANESTHESIA:  General.   INDICATIONS FOR PROCEDURE:  The patient is an 77 year old female who  presented with epigastric pain radiating through to her back,  hyperlipasemia.  She also had slight elevation of liver function tests.  She  was noted to have gallstones on her ultrasound and was admitted with biliary  pancreatitis.  She improved fairly rapidly over 48 hours and now presents  for cholecystectomy.  The procedure, rationale and risks were explained to  her preoperatively.   DESCRIPTION OF PROCEDURE:  The patient was brought to the operating room,  placed supine on the operating table and a general anesthetic was  administered.  Her abdominal wall was then sterilely prepped and draped.  Plain Marcaine solution was infiltrated in the subumbilical region and a  subumbilical incision was made through the skin, subcutaneous tissue, fascia  and peritoneum under direct vision.  A pursestring suture of 3-0 Vicryl was  placed around the fascial edges.  A Hasson trocar was introduced into the  peritoneal cavity and a pneumoperitoneum was created by insufflation of CO2  gas.  Next a laparoscope was introduced and no visceral injury was noted.  She was placed in reverse Trendelenburg position with the right side tilted  slightly upward.  An  11 mm trocar was then placed through an epigastric  incision. Two 5 mm trocars placed through the right midlateral abdomen.  The  gallbladder was visualized and the fundus was grasped and there were omental  adhesions taken down bluntly.  There was some edema around the infundibulum  of the gallbladder.  The fundus of the gallbladder was retracted toward the  right shoulder and the infundibulum retracted laterally and using careful  blunt dissection, the infundibulum was mobilized, the cystic duct was  identified and a window was created around it.  A clip was placed just above  the cystic duct gallbladder junction and incision made at the cystic duct  gallbladder junction.  A Cholangiocath was passed through the anterior  abdominal wall, put into the cystic duct and a cholangiogram was performed.   Under real time fluoroscopy, dilute contrast material was injected to the  cystic duct which was of moderate length.  Common hepatic, right and left  hepatic, and common bile ducts all filled promptly and  contrast drained  promptly into the duodenum without obvious evidence of obstruction.  Final  report is pending the radiologist's interpretation.   The Cholangiocath was removed, the cystic duct was then clipped three times  proximally and divided sharply.  The anterior branch of the cystic artery  was identified.  A window was created around it.  It was clipped and  divided.  The posterior branch was clipped and divided.  The gallbladder was  then dissected free from the liver bed with electrocautery.  A small  puncture wound was made with evacuation of some bile but no stones spilled.  Once the gallbladder was removed, it was placed in EndoPouch bag.  The  gallbladder fossa was then copiously irrigated with saline solution and  bleeding points controlled with the cautery.  It was inspected multiple  times.  There is no evidence of bile leak or further bleeding.  The  perihepatic fluid was  evacuated and at the end was clear.   The gallbladder was then removed through the subumbilical port in the  EndoPouch bag.  The subumbilical fascial defect was closed under  laparoscopic vision by tightening up and tying down the pursestring suture.  The remaining trocars were removed and the pneumoperitoneum was released.  Her skin incisions were closed with 4-0 Monocryl subcuticular stitches.  Steri-Strips and sterile dressings were applied.  The patient tolerated the  procedure well without any apparent complications and was taken to recovery  room in satisfactory condition.                                               Adolph Pollack, M.D.    Kari Baars  D:  08/23/2003  T:  08/23/2003  Job:  [id number]   cc:   Iva Boop, M.D. [facility]

## 2010-09-21 NOTE — H&P (Signed)
NAME:  Shelley Navarro, Shelley Navarro                    ACCOUNT NO.:  [id number]   MEDICAL RECORD NO.:  [id number]                   PATIENT TYPE:  INP   LOCATION:  3025                                 FACILITY:  [facility]   PHYSICIAN:  Iva Boop, M.D. [facility]           DATE OF BIRTH:  1920-07-01   DATE OF ADMISSION:  02/18/2002  DATE OF DISCHARGE:                                HISTORY & PHYSICAL   CHIEF COMPLAINT/REASON FOR ADMISSION:  Severe symptomatic anemia with  abnormal upper gastrointestinal and CT scan of the chest suggesting at least  ulceration if not malignancy in the esophagogastrojunction area.   HISTORY OF PRESENT ILLNESS:  This is a 77 year old white female that  recently saw Dr. Alwyn Ren secondary to dyspnea on exertion.  She denied any  significant chest pain or orthopnea or anything suggestive of heart failure.  As part of her workup, she had laboratory studies performed that showed a  hemoglobin of 6.7 yesterday.  The last one that I see is a hemoglobin that  was normal in 2001.  Subsequent hemoglobin was 7.0 today.  Her MCV yesterday  was 82, RDW slightly elevated at 14.7, platelets 641, white cell count  normal.  She had a heme negative stool when Dr. Alwyn Ren saw her on 02/17/02,  according to the patient.  She did complain of black stools for several  weeks prior to this.  About four weeks ago she had back surgery.  She does  not admit any nonsteroidal use or aspirin or any problems with blood  thinners.  No abdominal pain, minimal nausea noted.  There is no dysphagia.  She has been on Fosamax a long time for osteoporosis, and that was just  stopped today.  A CT scan of the chest and an upper GI were performed on  02/17/02, and I have self viewed these as well as looked at the reports.  They suggest irregularity and ulceration of the distal esophagus in the  hiatal hernia area as well as a possible ulceration or malignancy in the  proximal stomach.  There may be redundancy  with the hiatal hernia.  It was  difficult to tell on these studies.  It does not clearly look like a  paraesophageal hernia, but I suppose that is possible.  One other  possibility is a benign diverticulum in the esophagus as well.  She denies  any dysphagia or weight loss.   REVIEW OF SYMPTOMS:  Otherwise notable for back pain that is improving after  surgery.  She has been weak and dizzy, and has significant dyspnea on  exertion.  There is no peripheral edema reported.  Again, she denies any  chest pain, fevers, or chills.  All other systems are negative at this time.   MEDICATIONS:  1. Flexeril.  2. Lotrel.  3. Os-Cal.  4. Fosamax each week.  5. Centrum Silver.  6. Tums.  7. Hydrocodone p.r.n. pain.  Dr. Alwyn Ren recommended iron  to her, and asked her to stop her Fosamax today.   PAST MEDICAL HISTORY:  1. Osteoporosis.  2. Hysterectomy.  3. Appendectomy.  4. She swallowed a needle as a child at age 62, and had peritonitis and     exploratory laparotomy.  5. Hypertension.  6. Osteoarthritis.  7. Flexible sigmoidoscopy in 1995 by Dr. Juanda Chance showed diverticulosis.   ALLERGIES:  SULFA.   SOCIAL HISTORY:  She is married.  No tobacco or alcohol.  Husband is here  today.  She is still quite active and drives a car typically.   FAMILY HISTORY:  No gastrointestinal malignancies reported.   PHYSICAL EXAMINATION:  GENERAL:  A pleasant elderly white female who is in  no obvious acute distress, though she is quite pale.  VITAL SIGNS:  Weight 133 pounds, blood pressure 120/52, pulse 80.  HEENT:  Eyes are anicteric.  The conjunctivae are pale.  Mouth:  Posterior  pharynx free of lesions, though the palate is pale.  NECK:  Supple, no thyromegaly or mass.  CHEST:  Kyphotic, but clear.  Resonant.  HEART:  S1 and S2.  There is a 2/6 systolic murmur.  There is no jugular  venous distention.  No rubs or gallops heard.  ABDOMEN:  Soft, nontender, bowel sounds are present, there are  multiple  surgical scars from previous operations.  RECTAL:  Manson Passey hard stool that is Hemoccult negative.  EXTREMITIES:  There is trace ankle edema in the lower extremities.  She has  no cyanosis or clubbing.  MENTAL STATUS:  She is alert and oriented x3.  LYMPH NODES:  No neck, supraclavicular, or inguinal adenopathy.   LABORATORY DATA:  As mentioned in the HPI.   Not mentioned above, the patient has had significant constipation with this  dark bowel movement activity.   ASSESSMENT:  1. Severe symptomatic anemia.  2. Abnormal esophagogastrojunction on CT scan and upper GI, suggestive of     possible benign ulceration versus malignant versus other problems with     complicated hiatal hernia.  It is hard for me to tell for sure from the     upper GI.  It did not look like she had a paraesophageal hernia.  It     certainly is possible that Fosamax may have caused some problems.  It is     hard to image that she is not symptomatic more then she is with respect     to these.  3. Melena by history, resolved.  4. Constipation.   PLAN:  1. Admit and transfuse.  Risks of transfusion explained to the patient,     specifically, risks of infection, including hepatitis or AIDS, or risk of     transfusion reaction.  2. Laboratory workup with iron studies, repeat CBC, chemistries, coags, B12,     and folate.  A CEA was ordered as well.  3. Upper GI endoscopy on 02/19/02, Dr. Juanda Chance who is the hospital     gastroenterologist this week will perform this.  This is explained to the     patient.  4.     Start Protonix.  We will start IV.  Continue Flexeril, her Tylenol, and her      Os-Cal.  Hold the Lotrel for the time being.   I appreciate the opportunity to care for this patient.  Iva Boop, M.D. [facility]    CEG/MEDQ  D:  02/18/2002  T:  02/18/2002  Job:  [id number]  cc:   Titus Dubin. Alwyn Ren, M.D. [facility]

## 2010-10-05 ENCOUNTER — Other Ambulatory Visit (INDEPENDENT_AMBULATORY_CARE_PROVIDER_SITE_OTHER): Payer: Medicare Other | Admitting: Internal Medicine

## 2010-10-05 DIAGNOSIS — M25559 Pain in unspecified hip: Secondary | ICD-10-CM

## 2010-10-05 DIAGNOSIS — M25551 Pain in right hip: Secondary | ICD-10-CM

## 2010-10-05 MED ORDER — TRAMADOL HCL 50 MG PO TABS: 50.0000 mg | ORAL_TABLET | Freq: Four times a day (QID) | ORAL | Status: DC | PRN

## 2010-10-05 NOTE — Telephone Encounter (Signed)
RX sent to pharmacy  

## 2011-02-24 ENCOUNTER — Emergency Department (HOSPITAL_COMMUNITY): Payer: Medicare Other

## 2011-02-24 ENCOUNTER — Emergency Department (HOSPITAL_COMMUNITY)
Admission: EM | Admit: 2011-02-24 | Discharge: 2011-02-24 | Disposition: A | Discharge status: Home / Self Care | Payer: Medicare Other | Attending: Emergency Medicine | Admitting: Emergency Medicine

## 2011-02-24 DIAGNOSIS — R51 Headache: Secondary | ICD-10-CM | POA: Insufficient documentation

## 2011-02-24 DIAGNOSIS — E119 Type 2 diabetes mellitus without complications: Secondary | ICD-10-CM | POA: Insufficient documentation

## 2011-02-24 DIAGNOSIS — I10 Essential (primary) hypertension: Secondary | ICD-10-CM | POA: Insufficient documentation

## 2011-02-24 DIAGNOSIS — M810 Age-related osteoporosis without current pathological fracture: Secondary | ICD-10-CM | POA: Insufficient documentation

## 2011-02-24 DIAGNOSIS — Z79899 Other long term (current) drug therapy: Secondary | ICD-10-CM | POA: Insufficient documentation

## 2011-02-24 DIAGNOSIS — M1990 Unspecified osteoarthritis, unspecified site: Secondary | ICD-10-CM | POA: Insufficient documentation

## 2011-02-24 DIAGNOSIS — M542 Cervicalgia: Secondary | ICD-10-CM | POA: Insufficient documentation

## 2011-02-25 ENCOUNTER — Encounter: Payer: Self-pay | Admitting: Internal Medicine

## 2011-02-25 ENCOUNTER — Ambulatory Visit (INDEPENDENT_AMBULATORY_CARE_PROVIDER_SITE_OTHER): Payer: Medicare Other | Admitting: Internal Medicine

## 2011-02-25 DIAGNOSIS — M25519 Pain in unspecified shoulder: Secondary | ICD-10-CM

## 2011-02-25 DIAGNOSIS — M25511 Pain in right shoulder: Secondary | ICD-10-CM

## 2011-02-25 DIAGNOSIS — E119 Type 2 diabetes mellitus without complications: Secondary | ICD-10-CM

## 2011-02-25 DIAGNOSIS — M542 Cervicalgia: Secondary | ICD-10-CM

## 2011-02-25 MED ORDER — PREDNISONE 20 MG PO TABS: 20.0000 mg | ORAL_TABLET | Freq: Two times a day (BID) | ORAL | Status: DC

## 2011-02-25 NOTE — Patient Instructions (Addendum)

## 2011-02-25 NOTE — Progress Notes (Signed)
  Subjective:    Patient ID: Shelley Navarro, female    DOB: Jul 28, 1920, 77 y.o.   MRN: [id number]  HPI NECK PAIN: Location: posterior neck & shoulders   Onset: 10/20   Severity: up to 9 Pain is described as: stabbing  Worse with: rotation , flexion or  extension   Better with: heat & ice with minimal benefit ; seen in ER 10/21 ; CT scan head & cervical spine Pain radiates to: no   Impaired range of motion: yes due to pain  History of repetitive motion:  no  History of overuse or hyperextension:  no  History of trauma:  no   Past history of similar problem:  no   Symptoms Back Pain:  no  Numbness/tingling:  no  Weakness:  no  Red Flags Fever:  no,   Headache:  yes, mid - R frontal  Bowel/bladder dysfunction:  no      Review of Systems     Objective:   Physical Exam Gen.: in obvious discomfort but appropriate and cooperative throughout exam. Head: Normocephalic without obvious abnormalities Eyes: No corneal or conjunctival inflammation noted.  Neck: No deformities, masses, or tenderness noted. Range of motion  Dramatically reduced; almost guarding. Thyroid small. Lungs: Normal respiratory effort; chest expands symmetrically. Lungs are clear to auscultation without rales, wheezes, or increased work of breathing. Heart: Normal rate and rhythm. Normal S1 and S2. No gallop, click, or rub. Grade 1 systolic  murmur.                                                                                   Musculoskeletal/extremities: Lordosis noted of  the thoracic spine. No clubbing, cyanosis, edema.Tone & strength  Normal.Joints:severe mixed finger changes. Nail health  good. Vascular: Carotid, radial artery are full and equal. No bruits present. Neurologic: Alert and oriented x3. Deep tendon reflexes symmetrical and normal.          Skin: Intact without suspicious lesions or rashes. Lymph: No cervical, axillary lymphadenopathy present. Psych: Mood and affect are normal. Normally  interactive                                                                                         Assessment & Plan:  #1 severe neck and shoulder girdle pain; probable polymyalgia rheumatica  Plan: See orders and recommendations

## 2011-02-26 LAB — BUN: BUN: 19 mg/dL (ref 6–23)

## 2011-02-26 LAB — SEDIMENTATION RATE: Sed Rate: 30 mm/hr — ABNORMAL HIGH (ref 0–22)

## 2011-02-26 LAB — CREATININE, SERUM: Creatinine, Ser: 0.8 mg/dL (ref 0.4–1.2)

## 2011-03-05 ENCOUNTER — Ambulatory Visit (INDEPENDENT_AMBULATORY_CARE_PROVIDER_SITE_OTHER): Payer: Medicare Other | Admitting: Internal Medicine

## 2011-03-05 ENCOUNTER — Encounter: Payer: Self-pay | Admitting: Internal Medicine

## 2011-03-05 VITALS — BP 122/70 | HR 72 | Temp 98.4°F | Wt 116.2 lb

## 2011-03-05 DIAGNOSIS — M353 Polymyalgia rheumatica: Secondary | ICD-10-CM

## 2011-03-05 MED ORDER — PREDNISONE 20 MG PO TABS: 20.0000 mg | ORAL_TABLET | ORAL | Status: AC

## 2011-03-05 NOTE — Patient Instructions (Signed)

## 2011-03-05 NOTE — Progress Notes (Signed)
  Subjective:    Patient ID: Shelley Navarro, female    DOB: February 18, 1921, 77 y.o.   MRN: [id number]  HPI NECK PAIN: Location: chiefly  in neck   Severity: decreased with steroids to 4/ 10; muscle relaxant Rxed by Neurologist Pain is described as: dull, previously stabbing  Worse with: ROM     Pain radiates to: slightly to shoulders   Impaired range of motion: some with extension & laterally Symptoms Numbness/tingling:  no  Weakness:  no Red Flags Fever:  no  Headache: improved , flare with neck pain Bowel/bladder dysfunction:  no  She was seen in the emergency room 10/21 for severe neck pain. CAT scans were done of the head and neck. No change in therapy was made.      Review of Systems she denies blurred vision, double vision, or loss of vision. She's had no pain in the temporomandibular joints with mastication.     Objective:   Physical Exam  She is in no acute distress; she does appear much more comfortable than at the last visit.  There is decreased lateral range of motion of the neck. Flexion is essentially normal.  She has no lymphadenopathy about the head, neck, or axilla  There is no tenderness or discomfort with pressure over the temples or TMJ joints. She has no significant pain with rotation of the shoulders.  She exhibits an S4 with slight slurring.  Strength appears essentially normal to opposition in the upper and lower extremities       Assessment & Plan:  #1 the history and response to to oral steroids is classic for polymyalgia rheumatica. There is no suggestion clinically of TMJ or temporal arteritis  Plan: Low dose daily prednisone with slow weaning over 10-12 months.

## 2011-03-27 ENCOUNTER — Ambulatory Visit: Payer: Medicare Other | Attending: Neurology | Admitting: Physical Therapy

## 2011-03-27 DIAGNOSIS — IMO0001 Reserved for inherently not codable concepts without codable children: Secondary | ICD-10-CM | POA: Insufficient documentation

## 2011-03-27 DIAGNOSIS — R293 Abnormal posture: Secondary | ICD-10-CM | POA: Insufficient documentation

## 2011-03-27 DIAGNOSIS — R269 Unspecified abnormalities of gait and mobility: Secondary | ICD-10-CM | POA: Insufficient documentation

## 2011-03-27 DIAGNOSIS — M47812 Spondylosis without myelopathy or radiculopathy, cervical region: Secondary | ICD-10-CM | POA: Insufficient documentation

## 2011-03-27 DIAGNOSIS — M542 Cervicalgia: Secondary | ICD-10-CM | POA: Insufficient documentation

## 2011-04-03 ENCOUNTER — Ambulatory Visit: Payer: Medicare Other | Admitting: Physical Therapy

## 2011-04-09 ENCOUNTER — Other Ambulatory Visit: Payer: Self-pay | Admitting: Internal Medicine

## 2011-04-10 ENCOUNTER — Ambulatory Visit: Payer: Medicare Other | Attending: Neurology | Admitting: Physical Therapy

## 2011-04-10 DIAGNOSIS — R269 Unspecified abnormalities of gait and mobility: Secondary | ICD-10-CM | POA: Insufficient documentation

## 2011-04-10 DIAGNOSIS — M47812 Spondylosis without myelopathy or radiculopathy, cervical region: Secondary | ICD-10-CM | POA: Insufficient documentation

## 2011-04-10 DIAGNOSIS — M542 Cervicalgia: Secondary | ICD-10-CM | POA: Insufficient documentation

## 2011-04-10 DIAGNOSIS — R293 Abnormal posture: Secondary | ICD-10-CM | POA: Insufficient documentation

## 2011-04-10 DIAGNOSIS — IMO0001 Reserved for inherently not codable concepts without codable children: Secondary | ICD-10-CM | POA: Insufficient documentation

## 2011-04-17 ENCOUNTER — Ambulatory Visit: Payer: Medicare Other

## 2011-04-22 ENCOUNTER — Ambulatory Visit: Payer: Medicare Other | Admitting: Internal Medicine

## 2011-04-23 ENCOUNTER — Encounter: Payer: Self-pay | Admitting: Internal Medicine

## 2011-04-23 ENCOUNTER — Ambulatory Visit (INDEPENDENT_AMBULATORY_CARE_PROVIDER_SITE_OTHER): Payer: Medicare Other | Admitting: Internal Medicine

## 2011-04-23 DIAGNOSIS — R269 Unspecified abnormalities of gait and mobility: Secondary | ICD-10-CM

## 2011-04-23 DIAGNOSIS — T887XXA Unspecified adverse effect of drug or medicament, initial encounter: Secondary | ICD-10-CM

## 2011-04-23 DIAGNOSIS — M353 Polymyalgia rheumatica: Secondary | ICD-10-CM

## 2011-04-23 DIAGNOSIS — R7309 Other abnormal glucose: Secondary | ICD-10-CM

## 2011-04-23 LAB — POTASSIUM: Potassium: 4.3 mEq/L (ref 3.5–5.1)

## 2011-04-23 LAB — SEDIMENTATION RATE: Sed Rate: 20 mm/hr (ref 0–22)

## 2011-04-23 NOTE — Patient Instructions (Signed)
.  Share results with all MDs seen  

## 2011-04-23 NOTE — Progress Notes (Signed)
  Subjective:    Patient ID: Shelley Navarro, female    DOB: 1920/10/09, 77 y.o.   MRN: [id number]  HPI She states that her polymyalgia rheumatica is 40-50% better; she's presently on prednisone 20 mg one half daily.  Her neurologist diagnosed cervical muscle strain and she's been treated by physical therapy.  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  Labs were reviewed; her most recent sedimentation rate was 30.    Review of Systems  She describes minor headaches which respond to Tylenol. She does note asymmetric weakness, chiefly in the left lower extremity.     Objective:   Physical Exam she is in no acute distress; she appears well-nourished  She has no tenderness to palpation over the temples. There is decreased range of motion of the neck laterally to either side.  She denies pain with passive or active flexion of the neck. She has no pain with rotation of the shoulders.  She has no lymphadenopathy about the neck or eczema.  Strength to confrontation in all extremities appears grossly normal.     Assessment & Plan:

## 2011-04-24 ENCOUNTER — Ambulatory Visit: Payer: Medicare Other

## 2011-05-01 ENCOUNTER — Ambulatory Visit: Payer: Medicare Other

## 2011-05-06 ENCOUNTER — Ambulatory Visit: Payer: Medicare Other

## 2011-05-14 ENCOUNTER — Telehealth: Payer: Self-pay | Admitting: Internal Medicine

## 2011-05-14 MED ORDER — TRAMADOL HCL 50 MG PO TABS: ORAL_TABLET | ORAL | Status: DC

## 2011-05-14 NOTE — Telephone Encounter (Signed)
Patient states that Dr. Alwyn Ren diagnosed her with arthritis and referred her to a physical therapist. She states that since she has started physical therapy she has been in pain and would like to know if Dr. Alwyn Ren would give her something to help with the pain.

## 2011-05-14 NOTE — Telephone Encounter (Signed)
Rx faxed to pharmacy. Pt aware.

## 2011-05-14 NOTE — Telephone Encounter (Signed)
Pls advise.  

## 2011-05-14 NOTE — Telephone Encounter (Signed)
Tramadol 50 mg one half to one every 6 hours as needed dispense 30

## 2011-05-15 ENCOUNTER — Ambulatory Visit: Payer: Medicare Other | Attending: Neurology

## 2011-05-15 DIAGNOSIS — R269 Unspecified abnormalities of gait and mobility: Secondary | ICD-10-CM | POA: Insufficient documentation

## 2011-05-15 DIAGNOSIS — IMO0001 Reserved for inherently not codable concepts without codable children: Secondary | ICD-10-CM | POA: Insufficient documentation

## 2011-05-15 DIAGNOSIS — M542 Cervicalgia: Secondary | ICD-10-CM | POA: Insufficient documentation

## 2011-05-15 DIAGNOSIS — R293 Abnormal posture: Secondary | ICD-10-CM | POA: Insufficient documentation

## 2011-05-15 DIAGNOSIS — M47812 Spondylosis without myelopathy or radiculopathy, cervical region: Secondary | ICD-10-CM | POA: Insufficient documentation

## 2011-05-22 ENCOUNTER — Ambulatory Visit: Payer: Medicare Other

## 2011-05-31 ENCOUNTER — Other Ambulatory Visit: Payer: Self-pay | Admitting: Neurology

## 2011-05-31 DIAGNOSIS — S161XXA Strain of muscle, fascia and tendon at neck level, initial encounter: Secondary | ICD-10-CM

## 2011-05-31 DIAGNOSIS — M47812 Spondylosis without myelopathy or radiculopathy, cervical region: Secondary | ICD-10-CM

## 2011-05-31 DIAGNOSIS — R269 Unspecified abnormalities of gait and mobility: Secondary | ICD-10-CM

## 2011-06-07 ENCOUNTER — Ambulatory Visit
Admission: RE | Admit: 2011-06-07 | Discharge: 2011-06-07 | Disposition: A | Discharge status: Home / Self Care | Payer: Medicare Other | Source: Ambulatory Visit | Attending: Neurology | Admitting: Neurology

## 2011-06-07 DIAGNOSIS — R269 Unspecified abnormalities of gait and mobility: Secondary | ICD-10-CM

## 2011-06-07 DIAGNOSIS — S161XXA Strain of muscle, fascia and tendon at neck level, initial encounter: Secondary | ICD-10-CM

## 2011-06-07 DIAGNOSIS — M47812 Spondylosis without myelopathy or radiculopathy, cervical region: Secondary | ICD-10-CM

## 2011-06-07 HISTORY — PX: OTHER SURGICAL HISTORY: SHX169

## 2011-06-07 MED ORDER — IOHEXOL 300 MG/ML  SOLN: 1.0000 mL | Freq: Once | INTRAMUSCULAR | Status: AC | PRN

## 2011-06-07 MED ORDER — TRIAMCINOLONE ACETONIDE 40 MG/ML IJ SUSP (RADIOLOGY): 40.0000 mg | Freq: Once | INTRAMUSCULAR | Status: DC

## 2011-08-07 ENCOUNTER — Other Ambulatory Visit (INDEPENDENT_AMBULATORY_CARE_PROVIDER_SITE_OTHER): Payer: Medicare Other

## 2011-08-07 DIAGNOSIS — M353 Polymyalgia rheumatica: Secondary | ICD-10-CM

## 2011-08-07 DIAGNOSIS — Z79899 Other long term (current) drug therapy: Secondary | ICD-10-CM

## 2011-08-07 DIAGNOSIS — T887XXA Unspecified adverse effect of drug or medicament, initial encounter: Secondary | ICD-10-CM

## 2011-08-07 LAB — POTASSIUM: Potassium: 4.1 mEq/L (ref 3.5–5.1)

## 2011-08-09 ENCOUNTER — Other Ambulatory Visit: Payer: Self-pay | Admitting: Internal Medicine

## 2011-08-09 NOTE — Telephone Encounter (Signed)
Refill done.  

## 2011-08-15 ENCOUNTER — Telehealth: Payer: Self-pay

## 2011-08-15 NOTE — Telephone Encounter (Signed)
Discuss with patient  

## 2011-08-15 NOTE — Telephone Encounter (Signed)
Left message with patient's husband to have patient return call when available  

## 2011-08-15 NOTE — Telephone Encounter (Signed)
Message copied by Maurice Small on Thu Aug 15, 2011  9:38 AM ------      Message from: Pecola Lawless      Created: Sun Aug 11, 2011 10:21 AM       Sed rate is stable; please see me before refilling  the Prednisone. [employer]

## 2011-08-16 ENCOUNTER — Encounter: Payer: Self-pay | Admitting: Internal Medicine

## 2011-08-16 ENCOUNTER — Ambulatory Visit (INDEPENDENT_AMBULATORY_CARE_PROVIDER_SITE_OTHER): Payer: Medicare Other | Admitting: Internal Medicine

## 2011-08-16 VITALS — BP 100/62 | HR 76 | Temp 97.7°F | Resp 14 | Wt 115.4 lb

## 2011-08-16 DIAGNOSIS — R3915 Urgency of urination: Secondary | ICD-10-CM

## 2011-08-16 DIAGNOSIS — E119 Type 2 diabetes mellitus without complications: Secondary | ICD-10-CM

## 2011-08-16 DIAGNOSIS — N390 Urinary tract infection, site not specified: Secondary | ICD-10-CM

## 2011-08-16 DIAGNOSIS — R300 Dysuria: Secondary | ICD-10-CM

## 2011-08-16 DIAGNOSIS — M353 Polymyalgia rheumatica: Secondary | ICD-10-CM

## 2011-08-16 DIAGNOSIS — N23 Unspecified renal colic: Secondary | ICD-10-CM

## 2011-08-16 DIAGNOSIS — R309 Painful micturition, unspecified: Secondary | ICD-10-CM

## 2011-08-16 DIAGNOSIS — R350 Frequency of micturition: Secondary | ICD-10-CM

## 2011-08-16 LAB — POCT URINALYSIS DIPSTICK
Bilirubin, UA: NEGATIVE
Glucose, UA: NEGATIVE
Ketones, UA: NEGATIVE
Nitrite, UA: NEGATIVE
pH, UA: 7.5

## 2011-08-16 MED ORDER — NITROFURANTOIN MONOHYD MACRO 100 MG PO CAPS: 100.0000 mg | ORAL_CAPSULE | Freq: Two times a day (BID) | ORAL | Status: DC

## 2011-08-16 MED ORDER — PHENAZOPYRIDINE HCL 200 MG PO TABS: 200.0000 mg | ORAL_TABLET | Freq: Three times a day (TID) | ORAL | Status: AC | PRN

## 2011-08-16 NOTE — Progress Notes (Signed)
  Subjective:    Patient ID: Shelley Navarro, female    DOB: 05-08-1920, 78 y.o.   MRN: [id number]  HPI Early this morning she began to have frequency with urination every 10 minutes associated with dysuria. She denies hematuria or pyuria . She may have had some minor left flank pain. She has not had fever, chills, or sweats. She is not taking any medication for this. Her last urinary tract infection was a year ago.    Review of Systems  Her last A1c was 5.9% in 10/12; FBS 130 on 08/07/11. She denies polydipsia, polyphagia or unexplained weight loss.  She is on metformin once daily after breakfast  She's been off prednisone for several months. Her neurologist is recently referred her to physical therapy for the back and neck discomfort. Sed rate was 10  On 4/3.     Objective:   Physical Exam  she is alert and oriented in no distress  She has no lymphadenopathy about the neck or axilla  There is no significant tenderness to palpation of the upper back or neck or temples.  There is no discomfort to percussion of the flanks.  Abdomen soft without tenderness to palpation         Assessment & Plan:  #1 urinary tract infection; dip urine reveals moderate blood and moderate leukocytes.  Plan: See orders and recommendations

## 2011-08-16 NOTE — Assessment & Plan Note (Signed)
Sedimentation rate 10 on 08/07/11 off steroids

## 2011-08-16 NOTE — Patient Instructions (Signed)
Drink as much nondairy fluids as possible. Avoid spicy foods or alcohol as  these may aggravate the bladder. Do not take decongestants. Avoid narcotics if possible. 

## 2011-08-16 NOTE — Assessment & Plan Note (Signed)
A1c will be checked to see if the metformin can be discontinued.

## 2011-08-21 ENCOUNTER — Telehealth: Payer: Self-pay | Admitting: *Deleted

## 2011-08-21 DIAGNOSIS — N390 Urinary tract infection, site not specified: Secondary | ICD-10-CM

## 2011-08-21 MED ORDER — NITROFURANTOIN MONOHYD MACRO 100 MG PO CAPS: 100.0000 mg | ORAL_CAPSULE | Freq: Two times a day (BID) | ORAL | Status: AC

## 2011-08-21 NOTE — Telephone Encounter (Signed)
Pt left VM that she is still having urinary urgency, and frequency. Pt notes that she has taking all the med Rx by Dr Alwyn Ren. Called Pt back to verify which med she has taking Pt indicated that she tool the pyridium and did not receive any other meds. Pt advise that Macrobid will be sent in to pharmacy and if she continues to have symptoms after she completes med a urine sample will be need so that we may culture it. Pt ok, verbalized understanding, Rx sent

## 2011-09-23 ENCOUNTER — Telehealth: Payer: Self-pay | Admitting: Internal Medicine

## 2011-09-23 NOTE — Telephone Encounter (Signed)
Caller: Shelley Navarro/Patient; PCP: Marga Melnick; CB#: [phone];  Call regarding  Right  Hip Pain; Onset 5/18, worsening 09/22/11.  Relates she feels it is bursitis.  Pain rated at 9 of 10; worse with walking.  Swelling in both feet and ankles. Reports she has to use cane or get assistance from spouse to walk across the room. Heating pad or rest  eases pain.  Emergent sx ruled out.  Advised see MD in 24 hours.  Appt. w/ Dr. Alwyn Ren on 09/24/11 at 11:00.  Home care and parameters for callback given per Hip Non-Injury protocol

## 2011-09-24 ENCOUNTER — Ambulatory Visit (INDEPENDENT_AMBULATORY_CARE_PROVIDER_SITE_OTHER): Payer: Medicare Other | Admitting: Internal Medicine

## 2011-09-24 ENCOUNTER — Encounter: Payer: Self-pay | Admitting: Internal Medicine

## 2011-09-24 VITALS — BP 118/76 | HR 66 | Temp 98.3°F | Wt 117.0 lb

## 2011-09-24 DIAGNOSIS — R609 Edema, unspecified: Secondary | ICD-10-CM

## 2011-09-24 DIAGNOSIS — I10 Essential (primary) hypertension: Secondary | ICD-10-CM

## 2011-09-24 DIAGNOSIS — M25551 Pain in right hip: Secondary | ICD-10-CM

## 2011-09-24 DIAGNOSIS — R600 Localized edema: Secondary | ICD-10-CM

## 2011-09-24 DIAGNOSIS — M25559 Pain in unspecified hip: Secondary | ICD-10-CM

## 2011-09-24 LAB — BASIC METABOLIC PANEL
CO2: 26 mEq/L (ref 19–32)
Calcium: 9.6 mg/dL (ref 8.4–10.5)
GFR: 70.83 mL/min (ref >60.00)
Glucose, Bld: 84 mg/dL (ref 70–99)
Potassium: 4.3 mEq/L (ref 3.5–5.1)
Sodium: 142 mEq/L (ref 135–145)

## 2011-09-24 LAB — SEDIMENTATION RATE: Sed Rate: 13 mm/hr (ref 0–22)

## 2011-09-24 MED ORDER — TRAMADOL HCL 50 MG PO TABS: ORAL_TABLET | ORAL | Status: DC

## 2011-09-24 MED ORDER — BENAZEPRIL HCL 20 MG PO TABS: 20.0000 mg | ORAL_TABLET | Freq: Every day | ORAL | Status: DC

## 2011-09-24 NOTE — Patient Instructions (Signed)
Your BP goal = AVERAGE < 135/85. Avoid ingestion of  excess salt/sodium.Cook with pepper & other spices . Use the salt substitute "No Salt"(unless your potassium has been elevated) OR the Mrs Dash products to season food @ the table. Avoid foods which taste salty or "vinegary" as their sodium contentet will be high.  Please try to go on My Chart within the next 24 hours to allow me to release the results directly to you.  

## 2011-09-24 NOTE — Progress Notes (Signed)
Subjective:    Patient ID: Shelley Navarro, female    DOB: 11/02/1920, 78 y.o.   MRN: [id number]  HPI Extremity pain Location:R hip Onset:09/21/11 during the  night Trigger/injury:no Pain quality:sharp Pain severity:10 on 10 scale Duration:minutes Radiation:no Exacerbating factors:weight bearing Treatment/response:Tylenol helps Past medical history/family history/social history were all reviewed and updated. Pertinent data: Status post almost 3 weeks of prednisone for shoulder girdle/neck pain and epidural steroid injection in the cervical spine 06/07/11. History of hip bursitis approximately 13 months ago which resolved with medication     Review of Systems Constitutional: no fever, chills, sweats, change in weight  Musculoskeletal:no  muscle cramps or pain; no  joint stiffness, redness, or swelling Skin:no rash, color change Neuro: some RLE weakness bilaterally;no  incontinence (stool/urine); slight numbness and tingling in RLE > LLE Heme:no lymphadenopathy; abnormal bruising or bleeding   She has had persistent edema of the legs for approximately 2 weeks. Course: stable Trigger/exacerbating factors: No excess intake of salt, prolonged standing or travel.She is on Amlodipine which can be  associated with edema . No recent  Steroids. Treatment/response:no treatment Constitutional: no fatigue; sleep  apnea but pain affects sleep                                                   Cardiovascular: no palpitations; tachycardia; claudication; paroxysmal nocturnal dyspnea Pulmonary:no cough; sputum reduction;some  exertional dyspnea Endocrinologic:some  temperature intolerance to heat; some hair loss            Objective:   Physical Exam Gen.:  well-nourished in appearance. Alert, appropriate and cooperative throughout exam.   Eyes: No corneal or conjunctival inflammation noted. No icterus  Neck: No deformities, masses, or tenderness noted. Range of motion decreased. Lungs:  Normal respiratory effort; chest expands symmetrically. Lungs are clear to auscultation without rales, wheezes, or increased work of breathing. Heart: Normal rate and rhythm. Normal S1 and S2. No gallop, click, or rub. S4 with slurring  .                                                                              Musculoskeletal/extremities: Lordosis of the thoracic spine; loss of spinal height. Severe osteoarthritic changes. Pain with rotation of the right hip. Negative straight leg raising. Deformed, elongated toenails. Trace-1/2+ pedal edema Vascular: Carotid, radial artery, dorsalis pedis and  posterior tibial pulses are full and equal. No bruits present. Neurologic: Alert and oriented x3. Deep tendon reflexes symmetrical and normal.          Skin: Intact without suspicious lesions or rashes. Lymph: No cervical, axillary lymphadenopathy present. Psych: Mood and affect are normal. Normally interactive  Assessment & Plan:  #1 hip pain; possible bursitis. Clinically polymyalgia rheumatica does not appear to be present.  #2 mild edema in the context of amlodipine therapy  #3 hypertension well-controlled  Plan: See orders and recommendations

## 2011-09-26 ENCOUNTER — Telehealth: Payer: Self-pay | Admitting: *Deleted

## 2011-09-26 ENCOUNTER — Other Ambulatory Visit (INDEPENDENT_AMBULATORY_CARE_PROVIDER_SITE_OTHER): Payer: Medicare Other

## 2011-09-26 DIAGNOSIS — R300 Dysuria: Secondary | ICD-10-CM

## 2011-09-26 NOTE — Progress Notes (Signed)
Urine only

## 2011-09-26 NOTE — Telephone Encounter (Signed)
Pt left msg on triage vmail requesting lab results from today.

## 2011-09-27 ENCOUNTER — Telehealth: Payer: Self-pay

## 2011-09-27 LAB — URINALYSIS
Nitrite: NEGATIVE
Specific Gravity, Urine: 1.006 (ref 1.005–1.030)
Urobilinogen, UA: 0.2 mg/dL (ref 0.0–1.0)
pH: 6 (ref 5.0–8.0)

## 2011-09-27 MED ORDER — CEPHALEXIN 500 MG PO CAPS: 500.0000 mg | ORAL_CAPSULE | Freq: Two times a day (BID) | ORAL | Status: DC

## 2011-09-27 MED ORDER — CIPROFLOXACIN HCL 500 MG PO TABS: 500.0000 mg | ORAL_TABLET | Freq: Two times a day (BID) | ORAL | Status: DC

## 2011-09-27 NOTE — Telephone Encounter (Signed)
Spoke with patient, patient aware Cipro to be sent in (pharmacy was verified)

## 2011-09-27 NOTE — Telephone Encounter (Signed)
Dr.Hopper patient was here yesterday for a Urinalysis. A trace of luke's was present, urine was sent for culture. Please advise if patient can start an ABX now?

## 2011-09-27 NOTE — Telephone Encounter (Signed)
Ciprofloxacin 500 mg twice a day dispense 10

## 2011-09-27 NOTE — Telephone Encounter (Signed)
Pharmacy called and stated the Cipro does not go well with the zanaflex rx'ed by another doctor.   I spoke with Dr.Hopper, change rx to Keflex 500 mg bid #10.  I called the pharmacy back and gave new rx for Keflex

## 2011-09-29 LAB — URINE CULTURE: Colony Count: >=100000

## 2011-11-15 ENCOUNTER — Other Ambulatory Visit: Payer: Self-pay | Admitting: Neurology

## 2011-11-15 ENCOUNTER — Ambulatory Visit
Admission: RE | Admit: 2011-11-15 | Discharge: 2011-11-15 | Disposition: A | Discharge status: Home / Self Care | Payer: Medicare Other | Source: Ambulatory Visit | Attending: Neurology | Admitting: Neurology

## 2011-11-15 DIAGNOSIS — R269 Unspecified abnormalities of gait and mobility: Secondary | ICD-10-CM

## 2011-11-15 DIAGNOSIS — M79609 Pain in unspecified limb: Secondary | ICD-10-CM

## 2011-11-15 DIAGNOSIS — R42 Dizziness and giddiness: Secondary | ICD-10-CM

## 2011-11-18 ENCOUNTER — Ambulatory Visit
Admission: RE | Admit: 2011-11-18 | Discharge: 2011-11-18 | Disposition: A | Discharge status: Home / Self Care | Payer: Medicare Other | Source: Ambulatory Visit | Attending: Neurology | Admitting: Neurology

## 2011-11-18 DIAGNOSIS — R42 Dizziness and giddiness: Secondary | ICD-10-CM

## 2011-11-18 DIAGNOSIS — R269 Unspecified abnormalities of gait and mobility: Secondary | ICD-10-CM

## 2011-11-22 ENCOUNTER — Other Ambulatory Visit: Payer: Self-pay | Admitting: Internal Medicine

## 2011-12-05 ENCOUNTER — Ambulatory Visit: Payer: Medicare Other | Attending: Neurology | Admitting: Physical Therapy

## 2011-12-05 DIAGNOSIS — IMO0001 Reserved for inherently not codable concepts without codable children: Secondary | ICD-10-CM | POA: Insufficient documentation

## 2011-12-05 DIAGNOSIS — R269 Unspecified abnormalities of gait and mobility: Secondary | ICD-10-CM | POA: Insufficient documentation

## 2011-12-11 ENCOUNTER — Ambulatory Visit: Payer: Medicare Other | Admitting: Physical Therapy

## 2011-12-13 ENCOUNTER — Ambulatory Visit: Payer: Medicare Other | Admitting: Physical Therapy

## 2011-12-17 ENCOUNTER — Ambulatory Visit: Payer: Medicare Other | Admitting: Physical Therapy

## 2011-12-19 ENCOUNTER — Encounter: Payer: Medicare Other | Admitting: Physical Therapy

## 2011-12-25 ENCOUNTER — Ambulatory Visit: Payer: Medicare Other

## 2011-12-27 ENCOUNTER — Ambulatory Visit: Payer: Medicare Other | Admitting: Physical Therapy

## 2011-12-28 ENCOUNTER — Ambulatory Visit (INDEPENDENT_AMBULATORY_CARE_PROVIDER_SITE_OTHER): Payer: Medicare Other | Admitting: Family Medicine

## 2011-12-28 ENCOUNTER — Encounter: Payer: Self-pay | Admitting: Family Medicine

## 2011-12-28 VITALS — BP 122/72 | HR 72 | Temp 97.9°F | Wt 108.0 lb

## 2011-12-28 DIAGNOSIS — R350 Frequency of micturition: Secondary | ICD-10-CM

## 2011-12-28 DIAGNOSIS — N390 Urinary tract infection, site not specified: Secondary | ICD-10-CM

## 2011-12-28 LAB — POCT URINALYSIS DIPSTICK
Bilirubin, UA: NEGATIVE
Blood, UA: POSITIVE
Ketones, UA: NEGATIVE
Nitrite, UA: NEGATIVE
Spec Grav, UA: <=1.005
pH, UA: 7

## 2011-12-28 MED ORDER — CEPHALEXIN 500 MG PO CAPS: ORAL_CAPSULE | ORAL | Status: DC

## 2011-12-28 NOTE — Progress Notes (Signed)
OFFICE NOTE  12/28/2011  CC:  Chief Complaint  Patient presents with  . Urinary Frequency    and burning, started yesterday     HPI: Patient is a 78 y.o. Caucasian female who is here for urinary frequency and burning. Onset yesterday afternoon.   Had urinary urgency w/ incontinence several times last night.  No gross hematuria.  She is just getting over a bad URI and had mild fevers with that but none recently with these sx's. Nasal mucous and cough is clearing up/breaking up some.    Pertinent PMH:  Hx of recurrent UTI DM 2  MEDS:  Outpatient Prescriptions Prior to Visit  Medication Sig Dispense Refill  . benazepril (LOTENSIN) 20 MG tablet Take 1 tablet (20 mg total) by mouth daily.  90 tablet  3  . bimatoprost (LUMIGAN) 0.03 % ophthalmic drops 1 drop as directed.        . calcium-vitamin D (OSCAL WITH D) 500-200 MG-UNIT per tablet Take 1 tablet by mouth daily.        . cephALEXin (KEFLEX) 500 MG capsule Take 1 capsule (500 mg total) by mouth 2 (two) times daily.  10 capsule  0  . Cholecalciferol (VITAMIN D3) 1000 UNITS CAPS Take by mouth daily.        . Ferrous Sulfate (IRON) 325 (65 FE) MG TABS Take 325 mg by mouth daily.        Marland Kitchen ibandronate (BONIVA) 150 MG tablet TAKE 1 MONTHLY  3 tablet  3  . levETIRAcetam (KEPPRA) 500 MG tablet Take 500 mg by mouth 2 (two) times daily.        . Multiple Vitamin (MULTIVITAMIN) tablet Take 1 tablet by mouth daily.        Marland Kitchen omeprazole (PRILOSEC) 20 MG capsule Take 20 mg by mouth daily.        . vitamin E 400 UNIT capsule Take 400 Units by mouth daily.        . metFORMIN (GLUCOPHAGE) 500 MG tablet TAKE 1 TABLET BY MOUTH EVERY DAY  180 tablet  0  . predniSONE (DELTASONE) 20 MG tablet Take 20 mg by mouth. 1/2 by mouth once daily       . tizanidine (ZANAFLEX) 2 MG capsule Take 2 mg by mouth 2 (two) times daily.      . traMADol (ULTRAM) 50 MG tablet Take 1 to 1/2 tablet every 6 hours as needed for pain.  30 tablet  0  Not currently on cephalexin,  prednisone, metformin,zanaflex, or tramadol as listed above.  PE: Blood pressure 122/72, pulse 72, temperature 97.9 F (36.6 C), temperature source Oral, weight 108 lb (48.988 kg), SpO2 98.00%. Gen: Alert, well appearing.  Patient is oriented to person, place, time, and situation. CV: RRR, no m/r/g.   LUNGS: CTA bilat, nonlabored resps, good aeration in all lung fields. ABD: soft, NT, BS normal  LAB: UA showed trace blood and large leukocyte esterace today.  IMPRESSION AND PLAN:  Uncomplicated UTI. Hx of recurrent UTI. Send urine for c/s.  Start keflex 500mg  tid x 10d.   FOLLOW UP: prn

## 2011-12-30 ENCOUNTER — Telehealth: Payer: Self-pay | Admitting: *Deleted

## 2011-12-30 NOTE — Telephone Encounter (Signed)
Pt seen at Saturday clinic. 

## 2011-12-30 NOTE — Telephone Encounter (Signed)
[facility] Triage Call Report Triage Record Num: [id number] Operator: Lodema Pilot Patient Name: Shelley Navarro Call Date & Time: 12/27/2011 5:04:33PM Patient Phone: [phone] PCP: Marga Melnick Patient Gender: Female PCP Fax : [phone] Patient DOB: [birth year] Practice Name: [facility] Reason for Call: Caller: Kenda/Patient; PCP: Marga Melnick; CB#: [phone]; Call regarding Urinary Pain/Bleeding; Onset: 12/27/11. Afebrile. Urinary burning and urgency. Per Urinary Symptoms - Female Protocol, scheduled appointment at [facility] office on 12/28/11 at 1015. Care advice given. Protocol(s) Used: Urinary Symptoms - Female Recommended Outcome per Protocol: See Provider within 24 hours Reason for Outcome: Has one or more urinary tract symptoms AND has not been previously evaluated Care Advice: Increase intake of fluids. Try to drink 8 oz. (.2 liter) every hour when awake, including unsweetened cranberry juice, unless on restricted fluids for other medical reasons. Take sips of fluid or eat ice chips if nauseated or vomiting. ~ ~ SYMPTOM / CONDITION MANAGEMENT ~ CAUTIONS Limit carbonated, alcoholic, and caffeinated beverages such as coffee, tea and soda. Avoid nonprescription cold and allergy medications that contain caffeine. Limit intake of tomatoes, fruit juices (except for unsweetened cranberry juice), dairy products, spicy foods, sugar, and artificial sweeteners (aspartame or saccharine). Stop or decrease smoking. Reducing exposure to bladder irritants may help lessen urgency. ~ Analgesic/Antipyretic Advice - Acetaminophen: Consider acetaminophen as directed on label or by pharmacist/provider for pain or fever PRECAUTIONS: - Use if there is no history of liver disease, alcoholism, or intake of three or more alcohol drinks per day - Only if approved by provider during pregnancy or when breastfeeding - During pregnancy, acetaminophen should not be taken  more than 3 consecutive days without te

## 2011-12-31 ENCOUNTER — Ambulatory Visit: Payer: Medicare Other | Admitting: Physical Therapy

## 2012-01-02 ENCOUNTER — Ambulatory Visit: Payer: Medicare Other | Admitting: Physical Therapy

## 2012-01-07 ENCOUNTER — Ambulatory Visit: Payer: Medicare Other | Attending: Neurology | Admitting: Physical Therapy

## 2012-01-07 ENCOUNTER — Encounter: Payer: Medicare Other | Admitting: Physical Therapy

## 2012-01-07 DIAGNOSIS — IMO0001 Reserved for inherently not codable concepts without codable children: Secondary | ICD-10-CM | POA: Insufficient documentation

## 2012-01-07 DIAGNOSIS — R269 Unspecified abnormalities of gait and mobility: Secondary | ICD-10-CM | POA: Insufficient documentation

## 2012-01-09 ENCOUNTER — Ambulatory Visit: Payer: Medicare Other | Admitting: Physical Therapy

## 2012-01-14 ENCOUNTER — Ambulatory Visit: Payer: Medicare Other | Admitting: Physical Therapy

## 2012-01-15 ENCOUNTER — Ambulatory Visit: Payer: Medicare Other | Admitting: Internal Medicine

## 2012-01-16 ENCOUNTER — Encounter: Payer: Medicare Other | Admitting: Physical Therapy

## 2012-01-17 ENCOUNTER — Ambulatory Visit (INDEPENDENT_AMBULATORY_CARE_PROVIDER_SITE_OTHER): Payer: Medicare Other | Admitting: Internal Medicine

## 2012-01-17 ENCOUNTER — Ambulatory Visit: Payer: Medicare Other | Admitting: Physical Therapy

## 2012-01-17 VITALS — BP 124/72 | HR 71 | Temp 97.8°F | Wt 112.0 lb

## 2012-01-17 DIAGNOSIS — R296 Repeated falls: Secondary | ICD-10-CM

## 2012-01-17 DIAGNOSIS — Z87440 Personal history of urinary (tract) infections: Secondary | ICD-10-CM

## 2012-01-17 DIAGNOSIS — Z9181 History of falling: Secondary | ICD-10-CM

## 2012-01-17 LAB — POCT URINALYSIS DIPSTICK
Bilirubin, UA: NEGATIVE
Blood, UA: NEGATIVE
Glucose, UA: NEGATIVE
Ketones, UA: NEGATIVE
Spec Grav, UA: <=1.005

## 2012-01-17 NOTE — Patient Instructions (Addendum)
Stay well hydrated. Drink to thirst up to 32 ounces of fluids daily.  Avoid spicy foods or alcohol as  these may aggravate the prostate. Do not take decongestants. Avoid narcotics if possible.

## 2012-01-17 NOTE — Progress Notes (Signed)
  Subjective:    Patient ID: Shelley Navarro, female    DOB: 07/16/1920, 78 y.o.   MRN: [id number]  HPI She has completed 8 weeks of physical therapy for paroxsymal dizziness and gait instability. They recommended that she use a cane with ambulation. A CAT scan of the head was done 11/29/11 because of a fall. The report isn't available in the EMR. She states Dr. Anne Hahn said that this result was stable with no acute change or injury. She also had films of the shoulder because of a fall 7/12; this revealed no fracture; osteopenia was present. She is on Boniva.  She has not had a recurrence of the dizziness or falls since she initiated physical therapy over 8 weeks ago.  She had a urinary tract infection 3 weeks ago. At this time she denies dysuria, hematuria, or pyuria.     Review of Systems Cardiac prodrome: no palpitations, irregular rhythm, heart rate change Neurologic prodrome: no headache, numbness and tingling, weakness Syncope:no Seizure activity:no Associated signs and symptoms: Visual change (blurred/double/loss):no Hearing loss/tinnitus:no Nausea/sweating:no Chest pain:no Dyspnea:no       Objective:   Physical Exam Gen.: adequately nourished in appearance. Alert, appropriate and cooperative throughout exam.  Eyes: No corneal or conjunctival inflammation noted.  Neck: No deformities, masses, or tenderness noted. Range of motion decreased laterally Lungs: Normal respiratory effort; chest expands symmetrically. Lungs are clear to auscultation without rales, wheezes, or increased work of breathing. Heart: Normal rate and rhythm. Normal S1 and S2. No gallop, click, or rub. S4 w/o murmur.                                                                                  Musculoskeletal/extremities: Marked lordosis  noted of  the thoracic spine. No clubbing, cyanosis,or edema noted. Range of motion  normal .Tone & strength  normal.Joints : severe OA finger changes. Nail health   good. Vascular: Carotid & radial artery pulses are full and equal. No bruits present. Neurologic: Alert and oriented x3.  Skin:Pale. Intact without suspicious lesions or rashes. Lymph: No cervical, axillary lymphadenopathy present. Psych: Mood and affect are normal. Normally interactive                                                                                         Assessment & Plan:  #1 dizziness with recurrent falls; now quiescent. Her descriptions suggest drop attacks. #2 recurrent UTIs  Plan: She should continue the physical therapy exercises and employ a cane. If symptoms recur  an event monitor is indicated  To R/O intermittent complete heart block or other dysrrhythmia  Repeat UA

## 2012-01-21 ENCOUNTER — Encounter: Payer: Medicare Other | Admitting: Physical Therapy

## 2012-01-23 ENCOUNTER — Encounter: Payer: Medicare Other | Admitting: Physical Therapy

## 2012-02-26 DIAGNOSIS — M47812 Spondylosis without myelopathy or radiculopathy, cervical region: Secondary | ICD-10-CM | POA: Insufficient documentation

## 2012-02-26 DIAGNOSIS — S139XXA Sprain of joints and ligaments of unspecified parts of neck, initial encounter: Secondary | ICD-10-CM | POA: Insufficient documentation

## 2012-02-26 DIAGNOSIS — M353 Polymyalgia rheumatica: Secondary | ICD-10-CM | POA: Insufficient documentation

## 2012-02-26 DIAGNOSIS — M79609 Pain in unspecified limb: Secondary | ICD-10-CM | POA: Insufficient documentation

## 2012-02-26 DIAGNOSIS — R6889 Other general symptoms and signs: Secondary | ICD-10-CM | POA: Insufficient documentation

## 2012-02-26 DIAGNOSIS — G40309 Generalized idiopathic epilepsy and epileptic syndromes, not intractable, without status epilepticus: Secondary | ICD-10-CM | POA: Insufficient documentation

## 2012-02-26 DIAGNOSIS — D518 Other vitamin B12 deficiency anemias: Secondary | ICD-10-CM | POA: Insufficient documentation

## 2012-02-26 DIAGNOSIS — R42 Dizziness and giddiness: Secondary | ICD-10-CM | POA: Insufficient documentation

## 2012-03-06 ENCOUNTER — Ambulatory Visit (INDEPENDENT_AMBULATORY_CARE_PROVIDER_SITE_OTHER): Payer: Medicare Other | Admitting: Internal Medicine

## 2012-03-06 ENCOUNTER — Encounter: Payer: Self-pay | Admitting: Internal Medicine

## 2012-03-06 VITALS — BP 140/80 | HR 83 | Temp 98.8°F | Wt 110.0 lb

## 2012-03-06 DIAGNOSIS — H579 Unspecified disorder of eye and adnexa: Secondary | ICD-10-CM

## 2012-03-06 DIAGNOSIS — G450 Vertebro-basilar artery syndrome: Secondary | ICD-10-CM

## 2012-03-06 DIAGNOSIS — M1990 Unspecified osteoarthritis, unspecified site: Secondary | ICD-10-CM

## 2012-03-06 DIAGNOSIS — R9389 Abnormal findings on diagnostic imaging of other specified body structures: Secondary | ICD-10-CM

## 2012-03-06 NOTE — Patient Instructions (Addendum)
Use the extenders for grasping low items; avoid prolonged positioning of the head in any direction, especially extreme neck flexion or extension.Review and correct the record as indicated. Please share record with all medical staff seen.

## 2012-03-06 NOTE — Progress Notes (Signed)
  Subjective:    Patient ID: Shelley Navarro, female    DOB: [birth year], 78 y.o.   MRN: [id number]  HPI  On 03/01/12 she had been leaning over cleaning out her refrigerator for approximately 30 minutes; when she stood up she began to feel dizzy and lightheaded. She was able to control her fall; she denies any head or musculoskeletal trauma. She was not evaluated at that time.  Prior to the episode she denied any cardiac prodrome such as change in heart rhythm or rate or palpitations. She also had no neurologic prodrome such as headache, limb numbness or tingling or weakness.  There was no trigger such as straining or pain .  This event was not associated with any seizure activity.       Review of Systems  There was no visual disturbance such as blurred , double vision or loss of vision. She had no tinnitus or hearing loss. She had no associated nausea, diaphoresis, chest pain, shortness of breath.   She is followed by Dr. Eulah Pont for glaucoma. He monitors her visual field. Obj.ective:   Physical Exam Gen. appearance: Well-nourished, in no distress Eyes: Extraocular motion intact, field of vision decreased OS laterally, vision grossly intact with lenses, no nystagmus ENT: Canals clear, tympanic membranes normal,  hearing grossly decreased bilaterally Neck: Normal range of motion decreased laterally, no masses, normal thyroid Cardiovascular: Rate and rhythm normal; no murmurs, gallops or extra heart sounds Muscle skeletal: Marked osteoarthritic finger changes , tone, &  strength normal Neuro:no cranial nerve deficit, deep tendon  reflexes normal, slow ambulation , using cane Lymph: No cervical or axillary LA Skin: Warm and dry without suspicious lesions or rashes Psych: no anxiety or mood change. Normally interactive and cooperative.         Assessment & Plan:  #1 fall; most likely etiology is vertebrobasilar insufficiency related to postural change #2 DJD #3 abnormal visual  field OS  Plan: Preventive interventions discussed; pathophysiology explained

## 2012-03-08 ENCOUNTER — Other Ambulatory Visit: Payer: Self-pay | Admitting: Internal Medicine

## 2012-03-09 ENCOUNTER — Ambulatory Visit: Payer: Medicare Other | Admitting: Internal Medicine

## 2012-03-10 NOTE — Telephone Encounter (Signed)
Rx sent and pt verified pharmacy.     MW

## 2012-03-23 ENCOUNTER — Telehealth: Payer: Self-pay | Admitting: Internal Medicine

## 2012-03-23 ENCOUNTER — Other Ambulatory Visit (INDEPENDENT_AMBULATORY_CARE_PROVIDER_SITE_OTHER): Payer: Medicare Other

## 2012-03-23 DIAGNOSIS — N390 Urinary tract infection, site not specified: Secondary | ICD-10-CM

## 2012-03-23 NOTE — Telephone Encounter (Signed)
pt called stated she knows she has UTI requesting ABX be sent to [facility] on [address], advised PT she would need to be seen to get ABX, pt stated her brother just died and she could not come in. she stated she has wet her pants x 7, she stated she just really needs something  Cb# [phone]

## 2012-03-23 NOTE — Telephone Encounter (Signed)
She should go by the [facility] and leave a urine for culture and sensitivity. Diagnoses dysuria and incontinence. Past history recurrent urinary tract infection.  I reviewed her chart and she has had significant urinary tract infections in the past. She can't take sulfa and the last bacteria was resistant to nitrofurantoin. As soon we have that urine in process; we could call short course of antibiotics in.

## 2012-03-23 NOTE — Telephone Encounter (Signed)
Dr.Hopper please advise 

## 2012-03-23 NOTE — Telephone Encounter (Signed)
Spoke with patient, patient states she would rather come here. Scheduled patient for lab at 4 pm today for a urine dip and culture

## 2012-03-24 ENCOUNTER — Telehealth: Payer: Self-pay

## 2012-03-24 LAB — POCT URINALYSIS DIPSTICK
Bilirubin, UA: NEGATIVE
Glucose, UA: NEGATIVE
Ketones, UA: NEGATIVE
Spec Grav, UA: 1.01

## 2012-03-24 MED ORDER — CIPROFLOXACIN HCL 500 MG PO TABS: 500.0000 mg | ORAL_TABLET | Freq: Two times a day (BID) | ORAL | Status: DC

## 2012-03-24 NOTE — Telephone Encounter (Signed)
We received notification from [facility] on [address] that Cipro and Zanaflex interact (Cipro may inhibit the metabolism of Zanaflex). Per Dr.Hopper have patient hold Zanaflex while taking Cipro.  I contacted Marylene Land at the pharmacy and informed her of Dr.Hopper's response   I contacted the patient and informed her as well and she verbalized understanding of HOLDING Zanaflex while taking Cipro.

## 2012-03-24 NOTE — Telephone Encounter (Signed)
Message was sent to me from provider to send in rx for Cipro, after opening encounter I seen that provider already sent rx in. I contacted patient and she was already informed rx sent in

## 2012-03-24 NOTE — Telephone Encounter (Signed)
Rx sent. Called pt to verify pharmacy, make aware Rx sent, and advise instructions for taking med. Pt stated understanding.   MW

## 2012-03-24 NOTE — Telephone Encounter (Signed)
Pt called [facility] triage line stating came in yesterday about UTI. Pt states having urine frequency and pain. Pt requesting a Cb states she needs to talk to Chrae Hop nurse. Plz advise pt      MW

## 2012-03-25 LAB — URINE CULTURE: Colony Count: >=100000

## 2012-09-09 ENCOUNTER — Other Ambulatory Visit: Payer: Self-pay | Admitting: Internal Medicine

## 2012-09-09 NOTE — Telephone Encounter (Signed)
Med filled.  

## 2012-09-10 ENCOUNTER — Other Ambulatory Visit: Payer: Self-pay | Admitting: Neurology

## 2012-09-25 ENCOUNTER — Telehealth: Payer: Self-pay | Admitting: Internal Medicine

## 2012-09-25 ENCOUNTER — Ambulatory Visit (INDEPENDENT_AMBULATORY_CARE_PROVIDER_SITE_OTHER): Payer: Medicare Other | Admitting: Internal Medicine

## 2012-09-25 ENCOUNTER — Ambulatory Visit: Payer: Medicare Other

## 2012-09-25 VITALS — BP 124/62 | HR 88 | Temp 98.1°F | Resp 16 | Ht <= 58 in | Wt 99.0 lb

## 2012-09-25 DIAGNOSIS — S20219A Contusion of unspecified front wall of thorax, initial encounter: Secondary | ICD-10-CM

## 2012-09-25 DIAGNOSIS — R079 Chest pain, unspecified: Secondary | ICD-10-CM

## 2012-09-25 DIAGNOSIS — R0781 Pleurodynia: Secondary | ICD-10-CM

## 2012-09-25 DIAGNOSIS — S20212A Contusion of left front wall of thorax, initial encounter: Secondary | ICD-10-CM

## 2012-09-25 NOTE — Progress Notes (Signed)
  Subjective:    Patient ID: Shelley Navarro, female    DOB: 1921-04-21, 79 y.o.   MRN: [id number]  HPI At home, almost fell but caught herself, felt diffuse left lateral chest wall pain later. No sob, no cough, no blood. Is in PT to prevent falls. Not in any distress.   Review of Systems See list     Objective:   Physical Exam  Vitals reviewed. Constitutional: She is oriented to person, place, and time. She appears well-developed and well-nourished. No distress.  Eyes: EOM are normal.  Neck: Neck supple.  Cardiovascular: Normal rate, regular rhythm and normal heart sounds.   Pulmonary/Chest: Effort normal and breath sounds normal. No respiratory distress. She has no rales. She exhibits tenderness.  Abdominal: Soft. There is no tenderness.  Musculoskeletal: Normal range of motion.  Neurological: She is alert and oriented to person, place, and time. She has normal strength. No cranial nerve deficit or sensory deficit. Coordination and gait abnormal.  Skin: No rash noted.  Psychiatric: She has a normal mood and affect. Her behavior is normal. Judgment and thought content normal.   [facility] reading (PRIMARY) by  Dr Shelley Navarro no fx seen         Assessment & Plan:  Contusion ribs Continue fall prevention program Tylenol prn pain

## 2012-09-25 NOTE — Telephone Encounter (Signed)
Patient Information:  Caller Name: Chauntel  Phone: [phone]  Patient: Shelley Navarro, Shelley Navarro  Gender: Female  DOB: 1921-01-23  Age: 79 Years  PCP: Marga Melnick  Office Follow Up:  Does the office need to follow up with this patient?: Yes  Instructions For The Office: No appts are available.  Can pt be worked into schedule or does pt need to go to the ER?  RN Note:  pt describes her back pain as severe (she is unable to lie down)  Symptoms  Reason For Call & Symptoms: pt reports she had a fall in the middle of the night, pt landed on left side.  pt is having pain on left side under shoulder blade and under her arm.  Pain did not start until today (09/24/12).  Pt reports she did not fall all the down and did not hit her head  Reviewed Health History In EMR: Yes  Reviewed Medications In EMR: Yes  Reviewed Allergies In EMR: Yes  Reviewed Surgeries / Procedures: Yes  Date of Onset of Symptoms: 09/22/2012  Guideline(s) Used:  Back Pain  Disposition Per Guideline:   Go to ED Now (or to Office with PCP Approval)  Reason For Disposition Reached:   Sudden onset of severe back pain and age > 47  Advice Given:  N/A  Patient Will Follow Care Advice:  YES

## 2012-09-25 NOTE — Patient Instructions (Addendum)
Fall Prevention and Home Safety Falls cause injuries and can affect all age groups. It is possible to use preventive measures to significantly decrease the likelihood of falls. There are many simple measures which can make your home safer and prevent falls. OUTDOORS  Repair cracks and edges of walkways and driveways.  Remove high doorway thresholds.  Trim shrubbery on the main path into your home.  Have good outside lighting.  Clear walkways of tools, rocks, debris, and clutter.  Check that handrails are not broken and are securely fastened. Both sides of steps should have handrails.  Have leaves, snow, and ice cleared regularly.  Use sand or salt on walkways during winter months.  In the garage, clean up grease or oil spills. BATHROOM  Install night lights.  Install grab bars by the toilet and in the tub and shower.  Use non-skid mats or decals in the tub or shower.  Place a plastic non-slip stool in the shower to sit on, if needed.  Keep floors dry and clean up all water on the floor immediately.  Remove soap buildup in the tub or shower on a regular basis.  Secure bath mats with non-slip, double-sided rug tape.  Remove throw rugs and tripping hazards from the floors. BEDROOMS  Install night lights.  Make sure a bedside light is easy to reach.  Do not use oversized bedding.  Keep a telephone by your bedside.  Have a firm chair with side arms to use for getting dressed.  Remove throw rugs and tripping hazards from the floor. KITCHEN  Keep handles on pots and pans turned toward the center of the stove. Use back burners when possible.  Clean up spills quickly and allow time for drying.  Avoid walking on wet floors.  Avoid hot utensils and knives.  Position shelves so they are not too high or low.  Place commonly used objects within easy reach.  If necessary, use a sturdy step stool with a grab bar when reaching.  Keep electrical cables out of the  way.  Do not use floor polish or wax that makes floors slippery. If you must use wax, use non-skid floor wax.  Remove throw rugs and tripping hazards from the floor. STAIRWAYS  Never leave objects on stairs.  Place handrails on both sides of stairways and use them. Fix any loose handrails. Make sure handrails on both sides of the stairways are as long as the stairs.  Check carpeting to make sure it is firmly attached along stairs. Make repairs to worn or loose carpet promptly.  Avoid placing throw rugs at the top or bottom of stairways, or properly secure the rug with carpet tape to prevent slippage. Get rid of throw rugs, if possible.  Have an electrician put in a light switch at the top and bottom of the stairs. OTHER FALL PREVENTION TIPS  Wear low-heel or rubber-soled shoes that are supportive and fit well. Wear closed toe shoes.  When using a stepladder, make sure it is fully opened and both spreaders are firmly locked. Do not climb a closed stepladder.  Add color or contrast paint or tape to grab bars and handrails in your home. Place contrasting color strips on first and last steps.  Learn and use mobility aids as needed. Install an electrical emergency response system.  Turn on lights to avoid dark areas. Replace light bulbs that burn out immediately. Get light switches that glow.  Arrange furniture to create clear pathways. Keep furniture in the same place.    Firmly attach carpet with non-skid or double-sided tape.  Eliminate uneven floor surfaces.  Select a carpet pattern that does not visually hide the edge of steps.  Be aware of all pets. OTHER HOME SAFETY TIPS  Set the water temperature for 120 F (48.8 C).  Keep emergency numbers on or near the telephone.  Keep smoke detectors on every level of the home and near sleeping areas. Document Released: 04/12/2002 Document Revised: 10/22/2011 Document Reviewed: 07/12/2011 [facility] Patient Information 2014  [place], [place]. Rib Contusion A rib contusion (bruise) can occur by a blow to the chest or by a fall against a hard object. Usually these will be much better in a couple weeks. If X-rays were taken today and there are no broken bones (fractures), the diagnosis of bruising is made. However, broken ribs may not show up for several days, or may be discovered later on a routine X-ray when signs of healing show up. If this happens to you, it does not mean that something was missed on the X-ray, but simply that it did not show up on the first X-rays. Earlier diagnosis will not usually change the treatment. HOME CARE INSTRUCTIONS   Avoid strenuous activity. Be careful during activities and avoid bumping the injured ribs. Activities that pull on the injured ribs and cause pain should be avoided, if possible.  For the first day or two, an ice pack used every 20 minutes while awake may be helpful. Put ice in a plastic bag and put a towel between the bag and the skin.  Eat a normal, well-balanced diet. Drink plenty of fluids to avoid constipation.  Take deep breaths several times a day to keep lungs free of infection. Try to cough several times a day. Splint the injured area with a pillow while coughing to ease pain. Coughing can help prevent pneumonia.  Wear a rib belt or binder only if told to do so by your caregiver. If you are wearing a rib belt or binder, you must do the breathing exercises as directed by your caregiver. If not used properly, rib belts or binders restrict breathing which can lead to pneumonia.  Only take over-the-counter or prescription medicines for pain, discomfort, or fever as directed by your caregiver. SEEK MEDICAL CARE IF:   You or your child has an oral temperature above 102 F (38.9 C).  Your baby is older than 3 months with a rectal temperature of 100.5 F (38.1 C) or higher for more than 1 day.  You develop a cough, with thick or bloody sputum. SEEK IMMEDIATE MEDICAL CARE  IF:   You have difficulty breathing.  You feel sick to your stomach (nausea), have vomiting or belly (abdominal) pain.  You have worsening pain, not controlled with medications, or there is a change in the location of the pain.  You develop sweating or radiation of the pain into the arms, jaw or shoulders, or become light headed or faint.  You or your child has an oral temperature above 102 F (38.9 C), not controlled by medicine.  Your or your baby is older than 3 months with a rectal temperature of 102 F (38.9 C) or higher.  Your baby is 50 months old or younger with a rectal temperature of 100.4 F (38 C) or higher. MAKE SURE YOU:   Understand these instructions.  Will watch your condition.  Will get help right away if you are not doing well or get worse. Document Released: 01/15/2001 Document Revised: 07/15/2011 Document Reviewed: 12/09/2007  [employer] Patient Information 2014 [employer].  

## 2012-09-25 NOTE — Telephone Encounter (Signed)
Spoke with Pt advise her to be seen in ED or UC. Pt sates that she will go to [facility] for evaluation.

## 2012-09-25 NOTE — Progress Notes (Signed)
  Subjective:    Patient ID: Shelley Navarro, female    DOB: [birth year], 79 y.o.   MRN: [id number]  HPI Larey Seat on Tuesday. INjured her left posterior ribs at the posterior axillary line. States she is breathing ok,  But it hurts when she coughs. She also hurts in the front. She states she fell on her left side. She didn't hit the floor. She caught herself, and didn't feel the pain until 3 days later.    Review of Systems     Objective:   Physical Exam        Assessment & Plan:

## 2012-10-14 ENCOUNTER — Encounter: Payer: Self-pay | Admitting: Neurology

## 2012-10-14 ENCOUNTER — Ambulatory Visit (INDEPENDENT_AMBULATORY_CARE_PROVIDER_SITE_OTHER): Payer: Medicare Other | Admitting: Neurology

## 2012-10-14 VITALS — BP 179/79 | HR 92 | Ht 59.0 in | Wt 108.0 lb

## 2012-10-14 DIAGNOSIS — G40309 Generalized idiopathic epilepsy and epileptic syndromes, not intractable, without status epilepticus: Secondary | ICD-10-CM

## 2012-10-14 DIAGNOSIS — R6889 Other general symptoms and signs: Secondary | ICD-10-CM

## 2012-10-14 DIAGNOSIS — M79609 Pain in unspecified limb: Secondary | ICD-10-CM

## 2012-10-14 DIAGNOSIS — D518 Other vitamin B12 deficiency anemias: Secondary | ICD-10-CM

## 2012-10-14 DIAGNOSIS — M47812 Spondylosis without myelopathy or radiculopathy, cervical region: Secondary | ICD-10-CM

## 2012-10-14 DIAGNOSIS — R269 Unspecified abnormalities of gait and mobility: Secondary | ICD-10-CM

## 2012-10-14 DIAGNOSIS — R42 Dizziness and giddiness: Secondary | ICD-10-CM

## 2012-10-14 DIAGNOSIS — M353 Polymyalgia rheumatica: Secondary | ICD-10-CM

## 2012-10-14 MED ORDER — LEVETIRACETAM 500 MG PO TABS: 500.0000 mg | ORAL_TABLET | Freq: Two times a day (BID) | ORAL | Status: DC

## 2012-10-14 NOTE — Progress Notes (Signed)
Reason for visit: Gait disorder  Shelley Navarro is an 79 y.o. female  History of present illness:  Shelley Navarro is an 79 year old right-handed white female with a history of a chronic gait disorder. The patient had been using a cane for ambulation, but within the last 3 months, she has had an alteration or a worsening of her balance. The patient has had several falls, with the last fall occurring 5 weeks ago. The patient is now using a walker for ambulation, and she has not fallen while using a walker. The patient indicates that just prior to the fall, she has the sensation of oscillopsia and some slight dizziness. The patient may then fall. The patient does not lose consciousness or have confusion around the time of the fall. The patient developed some left lateral chest wall pain following one of the falls. The patient returns for an evaluation. The patient last had physical therapy for her balance and walking one year ago.  Past Medical History  Diagnosis Date  . Anemia   . Glaucoma   . Diverticulitis 1995  . Osteoporosis     s/p wrist and shoulder fractures  . Right bundle branch block   . Granulomatous lung disease     on xray  . Personal history of unspecified disease   . Personal history of other diseases of digestive system   . Type II or unspecified type diabetes mellitus without mention of complication, not stated as uncontrolled   . Osteoarthrosis, unspecified whether generalized or localized, unspecified site   . Unspecified essential hypertension   . External hemorrhoids without mention of complication   . Benign neoplasm of colon     Dr Leone Payor  . Barrett's esophagus   . Biliary acute pancreatitis 2005  . Hyperlipidemia     NMR lipoprofile 2008: LDL 131 (1260/397), HDL 68 TG 82  . PMR (polymyalgia rheumatica) 02/2011-06/2011  . Gait disturbance     Past Surgical History  Procedure Laterality Date  . Appendectomy    . Total abdominal hysterectomy w/  bilateral salpingoophorectomy    . Dilation and curettage of uterus    . Cholecystectomy  2003    Dr.Rosenbower  . Scrofula-ectomy    . Colonoscopy w/ polypectomy    . Tonsillectomy    . Orif shoulder fracture  1980  . Back surgery    . Cystectomy      Left Neck  . Wrist fracture surgery    . Esophagogastroduodenoscopy      Multiple EGD's Dr.Gessner  . Esi  06/07/11     [facility]    Family History  Problem Relation Age of Onset  . Heart attack Father 28  . Diabetes Mother   . Osteoporosis Mother   . Hip fracture Mother   . Emphysema Mother     Non-smoker  . Heart attack Brother 50  . Cancer Brother     bone  . Emphysema Brother   . Lung cancer Sister   . Cancer Sister     lung  . Bone cancer Other     Aunt-? M or P-Aunt  . Cancer Other     bone  . COPD Brother     Social history:  reports that she has never smoked. She does not have any smokeless tobacco history on file. She reports that she does not drink alcohol or use illicit drugs.  Allergies:  Allergies  Allergen Reactions  . Sulfonamide Derivatives     hallucinations  Medications:  Current Outpatient Prescriptions on File Prior to Visit  Medication Sig Dispense Refill  . benazepril (LOTENSIN) 20 MG tablet TAKE 1 TABLET BY MOUTH EVERY DAY  90 tablet  0  . bimatoprost (LUMIGAN) 0.03 % ophthalmic drops 1 drop as directed.        . calcium-vitamin D (OSCAL WITH D) 500-200 MG-UNIT per tablet Take 1 tablet by mouth daily.        . Cholecalciferol (VITAMIN D3) 1000 UNITS CAPS Take by mouth daily.        . Ferrous Sulfate (IRON) 325 (65 FE) MG TABS Take 325 mg by mouth daily.        Marland Kitchen ibandronate (BONIVA) 150 MG tablet TAKE 1 MONTHLY  3 tablet  3  . Multiple Vitamin (MULTIVITAMIN) tablet Take 1 tablet by mouth daily.        Marland Kitchen omeprazole (PRILOSEC) 20 MG capsule Take 20 mg by mouth daily.        . tizanidine (ZANAFLEX) 2 MG capsule Take 2 mg by mouth 2 (two) times daily.      . vitamin E 400 UNIT capsule  Take 400 Units by mouth daily.         No current facility-administered medications on file prior to visit.    ROS:  Out of a complete 14 system review of symptoms, the patient complains only of the following symptoms, and all other reviewed systems are negative.  Fatigue Cough Incontinence of bowel Feeling hot Joint pain, achy muscles Headache, weakness Decreased energy, change in appetite Insomnia, sleepiness  Blood pressure 179/79, pulse 92, height 4\' 11"  (1.499 m), weight 108 lb (48.988 kg).  Blood pressure right arm, standing, is 176/70. Blood pressure sitting, right arm, is 138/80.  Physical Exam  General: The patient is alert and cooperative at the time of the examination.  Neuromuscular: The patient has a kyphotic posture with ambulation.  Skin: No significant peripheral edema is noted.   Neurologic Exam  Cranial nerves: Facial symmetry is present. Speech is normal, no aphasia or dysarthria is noted. Extraocular movements are full. Visual fields are full.  Motor: The patient has good strength in all 4 extremities.  Coordination: The patient has good finger-nose-finger and heel-to-shin bilaterally.  Gait and station: The patient has a slightly wide-based gait, unsteady. The patient uses a walker for ambulation. Tandem gait was not attempted. No drift is seen.  Reflexes: Deep tendon reflexes are symmetric, but are depressed.   Assessment/Plan:  1. Chronic gait disorder with a recent worsening  The patient is having problems with gait imbalance, and some recent falls. The patient is now using a walker, but she does not seem to use the walker properly with turns. The patient be set up for physical therapy for gait training while using a walker. The patient appears to be having some mild dizziness just prior to the fall. The patient will followup in 5 or 6 months. The patient needs to focus on safety while ambulating.   Marlan Palau MD 10/14/2012 7:28  PM  [facility] [address] [place], [place] [address]  Phone [phone] Fax [phone]

## 2012-10-22 ENCOUNTER — Ambulatory Visit: Payer: Medicare Other | Attending: Neurology | Admitting: Physical Therapy

## 2012-10-22 DIAGNOSIS — IMO0001 Reserved for inherently not codable concepts without codable children: Secondary | ICD-10-CM | POA: Insufficient documentation

## 2012-10-22 DIAGNOSIS — R269 Unspecified abnormalities of gait and mobility: Secondary | ICD-10-CM | POA: Insufficient documentation

## 2012-10-22 DIAGNOSIS — M6281 Muscle weakness (generalized): Secondary | ICD-10-CM | POA: Insufficient documentation

## 2012-10-28 ENCOUNTER — Ambulatory Visit: Payer: Medicare Other | Admitting: Physical Therapy

## 2012-10-30 ENCOUNTER — Ambulatory Visit: Payer: Medicare Other | Admitting: Physical Therapy

## 2012-11-03 ENCOUNTER — Ambulatory Visit: Payer: Medicare Other | Attending: Neurology | Admitting: Physical Therapy

## 2012-11-03 ENCOUNTER — Encounter: Payer: Medicare Other | Admitting: Physical Therapy

## 2012-11-03 DIAGNOSIS — M6281 Muscle weakness (generalized): Secondary | ICD-10-CM | POA: Insufficient documentation

## 2012-11-03 DIAGNOSIS — R269 Unspecified abnormalities of gait and mobility: Secondary | ICD-10-CM | POA: Insufficient documentation

## 2012-11-03 DIAGNOSIS — IMO0001 Reserved for inherently not codable concepts without codable children: Secondary | ICD-10-CM | POA: Insufficient documentation

## 2012-11-05 ENCOUNTER — Ambulatory Visit: Payer: Medicare Other | Admitting: Physical Therapy

## 2012-11-10 ENCOUNTER — Ambulatory Visit: Payer: Medicare Other | Admitting: Physical Therapy

## 2012-11-13 ENCOUNTER — Ambulatory Visit: Payer: Medicare Other | Admitting: Physical Therapy

## 2012-11-17 ENCOUNTER — Ambulatory Visit: Payer: Medicare Other | Admitting: Physical Therapy

## 2012-11-19 ENCOUNTER — Ambulatory Visit: Payer: Medicare Other | Admitting: Physical Therapy

## 2012-12-01 ENCOUNTER — Ambulatory Visit: Payer: Medicare Other | Admitting: Physical Therapy

## 2012-12-03 ENCOUNTER — Ambulatory Visit: Payer: Medicare Other | Admitting: Physical Therapy

## 2012-12-07 ENCOUNTER — Ambulatory Visit: Payer: Medicare Other | Attending: Neurology | Admitting: Physical Therapy

## 2012-12-07 DIAGNOSIS — M6281 Muscle weakness (generalized): Secondary | ICD-10-CM | POA: Insufficient documentation

## 2012-12-07 DIAGNOSIS — R269 Unspecified abnormalities of gait and mobility: Secondary | ICD-10-CM | POA: Insufficient documentation

## 2012-12-07 DIAGNOSIS — IMO0001 Reserved for inherently not codable concepts without codable children: Secondary | ICD-10-CM | POA: Insufficient documentation

## 2012-12-09 ENCOUNTER — Other Ambulatory Visit: Payer: Self-pay | Admitting: Internal Medicine

## 2012-12-10 ENCOUNTER — Ambulatory Visit: Payer: Medicare Other | Admitting: Physical Therapy

## 2012-12-14 ENCOUNTER — Ambulatory Visit: Payer: Medicare Other | Admitting: Physical Therapy

## 2012-12-16 ENCOUNTER — Ambulatory Visit: Payer: Medicare Other | Admitting: Physical Therapy

## 2012-12-21 ENCOUNTER — Ambulatory Visit: Payer: Medicare Other | Admitting: Physical Therapy

## 2012-12-23 ENCOUNTER — Ambulatory Visit: Payer: Medicare Other | Admitting: Physical Therapy

## 2012-12-28 ENCOUNTER — Ambulatory Visit: Payer: Medicare Other | Admitting: Physical Therapy

## 2012-12-30 ENCOUNTER — Ambulatory Visit: Payer: Medicare Other | Admitting: Physical Therapy

## 2013-01-21 ENCOUNTER — Encounter: Payer: Self-pay | Admitting: Internal Medicine

## 2013-01-21 ENCOUNTER — Ambulatory Visit (INDEPENDENT_AMBULATORY_CARE_PROVIDER_SITE_OTHER): Payer: Medicare Other | Admitting: Internal Medicine

## 2013-01-21 VITALS — BP 140/80 | HR 76 | Temp 97.8°F | Wt 107.6 lb

## 2013-01-21 DIAGNOSIS — N390 Urinary tract infection, site not specified: Secondary | ICD-10-CM

## 2013-01-21 LAB — POCT URINALYSIS DIPSTICK
Protein, UA: NEGATIVE
Spec Grav, UA: <=1.005
Urobilinogen, UA: 0.2
pH, UA: 6

## 2013-01-21 MED ORDER — PHENAZOPYRIDINE HCL 200 MG PO TABS: 200.0000 mg | ORAL_TABLET | Freq: Three times a day (TID) | ORAL | Status: DC | PRN

## 2013-01-21 MED ORDER — AMOXICILLIN 500 MG PO CAPS: 500.0000 mg | ORAL_CAPSULE | Freq: Three times a day (TID) | ORAL | Status: DC

## 2013-01-21 NOTE — Assessment & Plan Note (Signed)
She will be placed on amoxicillin and Pyridium pending results of the culture and sensitivity.  If this proves to be a recurrent issue; reevaluation by gynecology or urology is indicated

## 2013-01-21 NOTE — Patient Instructions (Addendum)
Drink as much nondairy fluids as possible. Avoid spicy foods or alcohol as  these may aggravate the bladder. Do not take decongestants. Avoid narcotics if possible. 

## 2013-01-21 NOTE — Progress Notes (Signed)
  Subjective:    Patient ID: Shelley Navarro, female    DOB: June 20, 1920, 79 y.o.   MRN: [id number]  HPI  Symptoms began 01/20/13 at approximately 2 AM has frequency x8 over the next 4 hours until she arose. This was associated with dysuria and suprapubic sharp pain which did not radiate.  Remotely she was evaluated for recurrent urinary tract infections by Dr. Aldean Ast. Extensive evaluation was nondiagnostic  She demonstrated Escherichia coli urinary tract infections in May and November of last year. These were sensitive to all antibiotics except for nitrofurantoin.  She has a history of intolerance to sulfa    Review of Systems  She denies associated fever, chills, sweats, hematuria, or pyuria.     Objective:   Physical Exam General appearance is one of adequate nourishment w/o distress.  Eyes: No conjunctival inflammation or scleral icterus is present.  Oral exam:Tongue moist Heart:  Normal rate and regular rhythm. S1 and S2 normal without gallop, murmur, click, rub or other extra sounds     Lungs:Chest clear to auscultation; no wheezes, rhonchi,rales ,or rubs present.No increased work of breathing.   Marked kyphosis of the thoracic spine. Employing a rolling walker  Abdomen: bowel sounds normal, soft and non-tender without masses, organomegaly or hernias noted.  No guarding or rebound   Skin:Warm & dry.  Intact without suspicious lesions or rashes ; no jaundice ; minimal tenting  Lymphatic: No lymphadenopathy is noted about the head, neck, axilla             Assessment & Plan:  See Current Assessment & Plan in Problem List under specific Diagnosis

## 2013-01-23 LAB — URINE CULTURE

## 2013-01-26 ENCOUNTER — Encounter: Payer: Self-pay | Admitting: *Deleted

## 2013-02-11 ENCOUNTER — Other Ambulatory Visit: Payer: Self-pay | Admitting: Internal Medicine

## 2013-02-12 ENCOUNTER — Other Ambulatory Visit: Payer: Self-pay | Admitting: *Deleted

## 2013-02-12 MED ORDER — IBANDRONATE SODIUM 150 MG PO TABS: ORAL_TABLET | ORAL | Status: DC

## 2013-02-12 NOTE — Telephone Encounter (Signed)
Boniva refill sent to pharmacy

## 2013-03-01 ENCOUNTER — Other Ambulatory Visit: Payer: Self-pay | Admitting: Internal Medicine

## 2013-03-01 NOTE — Telephone Encounter (Signed)
Benazepril refill sent to pharmacy 

## 2013-04-04 ENCOUNTER — Other Ambulatory Visit: Payer: Self-pay | Admitting: Neurology

## 2013-04-05 ENCOUNTER — Telehealth: Payer: Self-pay | Admitting: Internal Medicine

## 2013-04-05 DIAGNOSIS — N390 Urinary tract infection, site not specified: Secondary | ICD-10-CM

## 2013-04-05 NOTE — Telephone Encounter (Signed)
Urinalysis and culture and sensitivity can be collected at [facility] lab. If she does have urinary tract infection; urology evaluation is indicated.

## 2013-04-05 NOTE — Telephone Encounter (Signed)
Please advise.//AB/CMA 

## 2013-04-05 NOTE — Telephone Encounter (Signed)
Patient is calling back to check the status of request below.

## 2013-04-05 NOTE — Telephone Encounter (Signed)
Patient states that she has another UTI and wants to know if Dr. Alwyn Ren can refill the medication that he has given her for this before.She prefers not to have to come in because she does not drive and has to find someone to bring her. Advised patient that there were no openings today with Dr. Alwyn Ren and she wants to know if she can just have a lab visit. Please advise.

## 2013-04-06 ENCOUNTER — Other Ambulatory Visit: Payer: Medicare Other

## 2013-04-06 DIAGNOSIS — N390 Urinary tract infection, site not specified: Secondary | ICD-10-CM

## 2013-04-06 NOTE — Telephone Encounter (Signed)
Spoke with the pt and informed her that she can go to our [place] lab and have a UA and UC done.  Pt understood and stated that she will try to get someone to take her b/c she does not drive.  Also informed the pt if she does have a UTI we will need to do a urology evaluation.  Pt agreed.//AB/CMA

## 2013-04-06 NOTE — Addendum Note (Signed)
Addended by: Verdie Shire on: 04/06/2013 08:49 AM   Modules accepted: Orders

## 2013-04-07 ENCOUNTER — Telehealth: Payer: Self-pay | Admitting: *Deleted

## 2013-04-07 NOTE — Telephone Encounter (Signed)
Patient called and was wondering if results from the urine culture that was done was back. Results are still processing.

## 2013-04-08 ENCOUNTER — Telehealth: Payer: Self-pay | Admitting: *Deleted

## 2013-04-08 DIAGNOSIS — N390 Urinary tract infection, site not specified: Secondary | ICD-10-CM

## 2013-04-08 LAB — URINE CULTURE: Colony Count: >=100000

## 2013-04-08 MED ORDER — NITROFURANTOIN MONOHYD MACRO 100 MG PO CAPS: 100.0000 mg | ORAL_CAPSULE | Freq: Two times a day (BID) | ORAL | Status: DC

## 2013-04-08 NOTE — Telephone Encounter (Signed)
Spoke with the pt and informed her that her UC was positive and we are going to call out Nitrofurantoin 100mg  bid #20, and then she is to F/U in 2 weeks for C&S.  Pt understood and agreed.  Asked the pt if she will need an reminder call regarding the C&S repeat and she stated no she will call.  New rx for Nitrofurantoin 100mg  bid #20 was sent to the pharmacy by e-script.  Order for C&S sent.//AB/CMA

## 2013-04-08 NOTE — Telephone Encounter (Signed)
Message copied by Verdie Shire on Thu Apr 08, 2013  3:32 PM ------      Message from: Pecola Lawless      Created: Thu Apr 08, 2013 10:39 AM       Please obtain final  sensitivity results for  the urine culture            ----- Message -----         From: [facility] Interface         Sent: 04/07/2013   6:08 PM           To: Pecola Lawless, MD                   ------

## 2013-04-20 ENCOUNTER — Other Ambulatory Visit: Payer: Medicare Other

## 2013-04-20 DIAGNOSIS — N390 Urinary tract infection, site not specified: Secondary | ICD-10-CM

## 2013-04-22 LAB — URINE CULTURE: Colony Count: 80000

## 2013-04-27 ENCOUNTER — Encounter: Payer: Self-pay | Admitting: *Deleted

## 2013-04-27 ENCOUNTER — Encounter: Payer: Self-pay | Admitting: Neurology

## 2013-04-27 ENCOUNTER — Ambulatory Visit (INDEPENDENT_AMBULATORY_CARE_PROVIDER_SITE_OTHER): Payer: Medicare Other | Admitting: Neurology

## 2013-04-27 VITALS — BP 170/73 | HR 80 | Wt 108.0 lb

## 2013-04-27 DIAGNOSIS — R5383 Other fatigue: Secondary | ICD-10-CM

## 2013-04-27 DIAGNOSIS — D518 Other vitamin B12 deficiency anemias: Secondary | ICD-10-CM

## 2013-04-27 DIAGNOSIS — R6889 Other general symptoms and signs: Secondary | ICD-10-CM

## 2013-04-27 DIAGNOSIS — R5381 Other malaise: Secondary | ICD-10-CM

## 2013-04-27 DIAGNOSIS — R269 Unspecified abnormalities of gait and mobility: Secondary | ICD-10-CM

## 2013-04-27 HISTORY — DX: Other malaise: R53.81

## 2013-04-27 HISTORY — DX: Other fatigue: R53.83

## 2013-04-27 NOTE — Patient Instructions (Signed)

## 2013-04-27 NOTE — Progress Notes (Signed)
Reason for visit: Gait disorder  ADALEAH FORGET is an 79 y.o. female  History of present illness:  Ms. Brucks is a 79 year old right-handed white female with a history of a gait disorder, and a history of presumed seizure events associated with dizziness and confusion. The patient has done well on Keppra, without any recurring events. The patient continues to have some gait instability, but she denies any falls over the last year. The patient uses a walker for ambulation. The patient is having increasing problems with fatigue and shortness of breath with physical activity. The patient has not had any routine blood work done in greater than 18 months. The patient has had frequent urinary tract infections, and she has had at least 4 over the last one year, she was recently treated for a recent bladder infection. The patient returns to the office today for an evaluation.  Past Medical History  Diagnosis Date  . Anemia   . Glaucoma   . Diverticulitis 1995  . Osteoporosis     s/p wrist and shoulder fractures  . Right bundle branch block   . Granulomatous lung disease     on xray  . Personal history of unspecified disease   . Personal history of other diseases of digestive system   . Type II or unspecified type diabetes mellitus without mention of complication, not stated as uncontrolled   . Osteoarthrosis, unspecified whether generalized or localized, unspecified site   . Unspecified essential hypertension   . External hemorrhoids without mention of complication   . Benign neoplasm of colon     Dr Leone Payor  . Barrett's esophagus   . Biliary acute pancreatitis 2005  . Hyperlipidemia     NMR lipoprofile 2008: LDL 131 (1260/397), HDL 68 TG 82  . PMR (polymyalgia rheumatica) 02/2011-06/2011  . Gait disturbance   . Other malaise and fatigue 04/27/2013    Past Surgical History  Procedure Laterality Date  . Appendectomy    . Total abdominal hysterectomy w/ bilateral  salpingoophorectomy    . Dilation and curettage of uterus    . Cholecystectomy  2003    Dr.Rosenbower  . Scrofula-ectomy    . Colonoscopy w/ polypectomy    . Tonsillectomy    . Orif shoulder fracture  1980  . Back surgery    . Cystectomy      Left Neck  . Wrist fracture surgery    . Esophagogastroduodenoscopy      Multiple EGD's Dr.Gessner  . Esi  06/07/11     [facility]    Family History  Problem Relation Age of Onset  . Heart attack Father 60  . Diabetes Mother   . Osteoporosis Mother   . Hip fracture Mother   . Emphysema Mother     Non-smoker  . Heart attack Brother 50  . Cancer Brother     bone  . Emphysema Brother   . Lung cancer Sister   . Cancer Sister     lung  . Bone cancer Other     Aunt-? M or P-Aunt  . Cancer Other     bone  . COPD Brother     Social history:  reports that she has never smoked. She has never used smokeless tobacco. She reports that she does not drink alcohol or use illicit drugs.    Allergies  Allergen Reactions  . Sulfonamide Derivatives     hallucinations    Medications:  Current Outpatient Prescriptions on File Prior to Visit  Medication Sig Dispense Refill  . benazepril (LOTENSIN) 20 MG tablet TAKE 1 TABLET BY MOUTH EVERY DAY  90 tablet  1  . bimatoprost (LUMIGAN) 0.03 % ophthalmic drops 1 drop as directed.        . calcium-vitamin D (OSCAL WITH D) 500-200 MG-UNIT per tablet Take 1 tablet by mouth daily.        . Cholecalciferol (VITAMIN D3) 1000 UNITS CAPS Take by mouth daily.        . Ferrous Sulfate (IRON) 325 (65 FE) MG TABS Take 325 mg by mouth daily.        Marland Kitchen ibandronate (BONIVA) 150 MG tablet TAKE 1 MONTHLY  3 tablet  3  . levETIRAcetam (KEPPRA) 500 MG tablet TAKE 1 TABLET (500 MG TOTAL) BY MOUTH 2 (TWO) TIMES DAILY.  60 tablet  0  . Multiple Vitamin (MULTIVITAMIN) tablet Take 1 tablet by mouth daily.        Marland Kitchen omeprazole (PRILOSEC) 20 MG capsule Take 20 mg by mouth daily.        . phenazopyridine (PYRIDIUM) 200 MG  tablet Take 1 tablet (200 mg total) by mouth 3 (three) times daily as needed for pain.  10 tablet  0  . tizanidine (ZANAFLEX) 2 MG capsule Take 2 mg by mouth 2 (two) times daily.      . vitamin E 400 UNIT capsule Take 400 Units by mouth daily.         No current facility-administered medications on file prior to visit.    ROS:  Out of a complete 14 system review of symptoms, the patient complains only of the following symptoms, and all other reviewed systems are negative.  Fatigue Neck pain, neck stiffness Cough, shortness of breath Black stools, constipation Incontinence of bladder, frequency of urination, urgency of bladder  Blood pressure 170/73, pulse 80, weight 108 lb (48.988 kg).  Physical Exam  General: The patient is alert and cooperative at the time of the examination.  Skin: 1+ edema of ankles is noted bilaterally.   Neurologic Exam  Mental status: The Mini-Mental status examination done today shows a total score of 29/30.  Cranial nerves: Facial symmetry is present. Speech is normal, no aphasia or dysarthria is noted. Extraocular movements are full. Visual fields are full.  Motor: The patient has good strength in all 4 extremities.  Sensory examination: Soft touch sensation is symmetric on the face, arms, and legs.  Coordination: The patient has good finger-nose-finger and heel-to-shin bilaterally.  Gait and station: The patient has a slightly wide-based, unsteady gait. The patient ambulates with a walker with good stability. Tandem gait was not attempted. Romberg is negative. No drift is seen.  Reflexes: Deep tendon reflexes are symmetric, but are depressed.   Assessment/Plan:  1. Gait disorder  2. Fatigue, shortness of breath  3. Episodic confusional episodes, under good control  The patient is doing relatively well at this point with the episodes of confusion, and she is tolerating Keppra well. The patient is having some increasing problems with  fatigue. We will check routine blood work today along with a thyroid profile and a vitamin B12 level. The patient will followup through this office in about 6 months. The patient is relatively stable while using a walker.  Marlan Palau MD 04/27/2013 7:38 PM  [facility] [address] [place], [place] [address]  Phone [phone] Fax [phone]

## 2013-04-28 ENCOUNTER — Telehealth: Payer: Self-pay | Admitting: Neurology

## 2013-04-28 LAB — COMPREHENSIVE METABOLIC PANEL
ALT: 15 IU/L (ref 0–32)
AST: 23 IU/L (ref 0–40)
Alkaline Phosphatase: 61 IU/L (ref 39–117)
BUN/Creatinine Ratio: 16 (ref 11–26)
CO2: 25 mmol/L (ref 18–29)
Calcium: 10 mg/dL (ref 8.6–10.2)
Chloride: 103 mmol/L (ref 97–108)
Creatinine, Ser: 0.92 mg/dL (ref 0.57–1.00)
Globulin, Total: 2.3 g/dL (ref 1.5–4.5)
Sodium: 144 mmol/L (ref 134–144)

## 2013-04-28 LAB — CBC WITH DIFFERENTIAL
Basos: 1 %
Eos: 3 %
Hemoglobin: 13.1 g/dL (ref 11.1–15.9)
Immature Grans (Abs): 0 10*3/uL (ref 0.0–0.1)
Immature Granulocytes: 0 %
Lymphs: 24 %
Monocytes Absolute: 0.8 10*3/uL (ref 0.1–0.9)
Monocytes: 12 %
Neutrophils Relative %: 60 %
Platelets: 311 10*3/uL (ref 150–379)
RBC: 4.56 x10E6/uL (ref 3.77–5.28)
WBC: 6.3 10*3/uL (ref 3.4–10.8)

## 2013-04-28 LAB — VITAMIN B12: Vitamin B-12: 1210 pg/mL — ABNORMAL HIGH (ref 211–946)

## 2013-04-28 LAB — TSH: TSH: 4.84 u[IU]/mL — ABNORMAL HIGH (ref 0.450–4.500)

## 2013-04-28 NOTE — Telephone Encounter (Signed)
I called patient. The blood work was unremarkable with exception that the TSH was slightly high, needs to be followed. I'll send blood work to the primary care physician. Nothing in the blood work that should be causing significant fatigue.

## 2013-05-04 ENCOUNTER — Other Ambulatory Visit: Payer: Self-pay | Admitting: Neurology

## 2013-05-07 ENCOUNTER — Ambulatory Visit (INDEPENDENT_AMBULATORY_CARE_PROVIDER_SITE_OTHER): Payer: Medicare Other | Admitting: Internal Medicine

## 2013-05-07 ENCOUNTER — Encounter: Payer: Self-pay | Admitting: Internal Medicine

## 2013-05-07 VITALS — BP 155/68 | HR 79 | Temp 98.5°F | Wt 106.0 lb

## 2013-05-07 DIAGNOSIS — N390 Urinary tract infection, site not specified: Secondary | ICD-10-CM

## 2013-05-07 DIAGNOSIS — K5900 Constipation, unspecified: Secondary | ICD-10-CM

## 2013-05-07 DIAGNOSIS — R5383 Other fatigue: Secondary | ICD-10-CM

## 2013-05-07 DIAGNOSIS — R5381 Other malaise: Secondary | ICD-10-CM

## 2013-05-07 LAB — POCT URINALYSIS DIPSTICK
BILIRUBIN UA: NEGATIVE
Blood, UA: NEGATIVE
GLUCOSE UA: NEGATIVE
Ketones, UA: NEGATIVE
LEUKOCYTES UA: NEGATIVE
Nitrite, UA: NEGATIVE
PH UA: 6
PROTEIN UA: NEGATIVE
UROBILINOGEN UA: NEGATIVE

## 2013-05-07 NOTE — Addendum Note (Signed)
Addended by: Modena Morrow D on: 05/07/2013 05:03 PM   Modules accepted: Orders

## 2013-05-07 NOTE — Addendum Note (Signed)
Addended by: Peggyann Shoals on: 05/07/2013 04:54 PM   Modules accepted: Orders

## 2013-05-07 NOTE — Progress Notes (Signed)
   Subjective:    Patient ID: Shelley Navarro, female    DOB: 1921/03/13, 80 y.o.   MRN: [id number]  HPI    On 12/2 she had over 100,000 Klebsiella pneumoniae resistant to ampicillin. After 10 days of nitrofurantoin; urine culture grew 80,000 colonies of Klebsiella pneumonia again.  In May and November of 2013 she had recurrent Escherichia coli urinary tract infections  She was evaluated by Dr. Serita Butcher, urologist remotely; no cause for recurrent urinary tract infections was found.       Review of Systems   She complains of constipation and fatigue. Her TSH was 4.834 on 04/27/13. She has a prior history of taking thyroid supplements  She denies dysuria, pyuria, or hematuria.  She does have some sweats at times but denies fever and chills.     Objective:   Physical Exam General appearance :adequate nourishment w/o distress but frail . Using walker  Eyes: No conjunctival inflammation or scleral icterus is present.  Oral exam: Dental hygiene is good; lips and gums are healthy appearing.There is no oropharyngeal erythema or exudate noted.   Heart:  Normal rate and regular rhythm. S1 and S2 normal without gallop, murmur, click, rub or other extra sounds     Lungs:Chest clear to auscultation; no wheezes, rhonchi,rales ,or rubs present.No increased work of breathing.   Abdomen: bowel sounds normal, soft and non-tender without masses, organomegaly or hernias noted.  No guarding or rebound . No tenderness over the flanks to percussion  Musculoskeletal: Decreased thoracic height Skin:Warm & dry.  Intact without suspicious lesions or rashes ; no jaundice or tenting  Lymphatic: No lymphadenopathy is noted about the head, neck, axilla.                Assessment & Plan:  #1 recurrent UTIs #2 constipation #3 fatigue See orders

## 2013-05-07 NOTE — Assessment & Plan Note (Signed)
Full TFTs ?

## 2013-05-07 NOTE — Progress Notes (Signed)
Pre visit review using our clinic review tool, if applicable. No additional management support is needed unless otherwise documented below in the visit note. 

## 2013-05-07 NOTE — Patient Instructions (Signed)
Natural interventions to treat or prevent constipation would include drinking to thirst, up to 32 ounces of fluids daily; eating 7-9 servings of fresh fruits or vegetables a day; and increasing roughage in the diet such as whole grains. The OTC  fiber products (Example: Metamucil, etc) can be employed if these natural maneuvers do not correct the issue. Finally MiraLax every third day as needed is an option. 

## 2013-05-07 NOTE — Assessment & Plan Note (Signed)
Urology referral

## 2013-05-07 NOTE — Assessment & Plan Note (Signed)
Natural interventions to treat or prevent constipation would include drinking to thirst, up to 32 ounces of fluids daily; eating 7-9 servings of fresh fruits or vegetables a day; and increasing roughage in the diet such as whole grains. The OTC  fiber products (Example: Metamucil, etc) can be employed if these natural maneuvers do not correct the issue. Finally MiraLax every third day as needed is an option. 

## 2013-05-09 LAB — URINE CULTURE

## 2013-05-10 ENCOUNTER — Encounter: Payer: Self-pay | Admitting: *Deleted

## 2013-08-20 ENCOUNTER — Ambulatory Visit (INDEPENDENT_AMBULATORY_CARE_PROVIDER_SITE_OTHER): Payer: Medicare Other | Admitting: Neurology

## 2013-08-20 ENCOUNTER — Encounter: Payer: Self-pay | Admitting: Neurology

## 2013-08-20 VITALS — BP 180/85 | HR 85 | Wt 104.0 lb

## 2013-08-20 DIAGNOSIS — R269 Unspecified abnormalities of gait and mobility: Secondary | ICD-10-CM

## 2013-08-20 DIAGNOSIS — R296 Repeated falls: Secondary | ICD-10-CM

## 2013-08-20 DIAGNOSIS — Z9181 History of falling: Secondary | ICD-10-CM

## 2013-08-20 DIAGNOSIS — B351 Tinea unguium: Secondary | ICD-10-CM

## 2013-08-20 NOTE — Progress Notes (Signed)
PATIENT: Shelley Navarro DOB: [birth year]  REASON FOR VISIT: follow up HISTORY FROM: patient  HISTORY OF PRESENT ILLNESS: Shelley Navarro is a 80 year old right-handed white female with a history of a gait disorder, and a history of presumed seizure events associated with dizziness and confusion. She returns today for follow up. Patient states that she" walks pretty good but she keeps falling." She uses the walker to ambulate. She fell 1 week ago and a total of 5 times this month. Three of those times they had to call EMS to come pick her up. She normally falls when she lets go of her walker. She states that her walker is too wide to go through some of the door frames. When she fell three weeks ago, she thinks she may have hurt her right foot. States it became swollen and started to hurt several days after her fall. She has been to physical therapy three times. Patient is on Keppra and tolerating it well. No reports of seizures. Patient continues to cook but states she gets really tired. She lives at home with her husband. They are unable to clean there house "like it should be." Feels like she and her husband do pretty well on there own. Her neighbors have been taking them to the grocery store and her appointments. Patient is unable to get in the bathtub or shower. She stands at the sink to take a "wash up." She is unable to get her walker in the bathroom. Up until last year she raised a garden and did a lot of yard work but she no longer does that due to falling.  Patient's neighbor brought them to this visit and asked to speak to me privately. She states that the patient and her husband needs assistance in there home. She states that the house has not been cleaned in years. She states they have "papers" stacked on the couch with a small place open for them to sit. Neighbor reports that she has fallen multiple times this month. She is concerned about there well being.   REVIEW OF SYSTEMS: Full 14  system review of systems performed and notable only for:  Constitutional: N/A  Eyes: N/A Ear/Nose/Throat: N/A  Skin: N/A  Cardiovascular: N/A  Respiratory: N/A  Gastrointestinal: N/A  Genitourinary: N/A Hematology/Lymphatic: N/A  Endocrine: N/A Musculoskeletal:N/A  Allergy/Immunology: N/A  Neurological: N/A Psychiatric: N/A Sleep: N/A   ALLERGIES: Allergies  Allergen Reactions  . Sulfonamide Derivatives     hallucinations    HOME MEDICATIONS: Outpatient Prescriptions Prior to Visit  Medication Sig Dispense Refill  . benazepril (LOTENSIN) 20 MG tablet TAKE 1 TABLET BY MOUTH EVERY DAY  90 tablet  1  . bimatoprost (LUMIGAN) 0.03 % ophthalmic drops 1 drop as directed.        . calcium-vitamin D (OSCAL WITH D) 500-200 MG-UNIT per tablet Take 1 tablet by mouth daily.        . Cholecalciferol (VITAMIN D3) 1000 UNITS CAPS Take by mouth daily.        . Ferrous Sulfate (IRON) 325 (65 FE) MG TABS Take 325 mg by mouth daily.        Marland Kitchen ibandronate (BONIVA) 150 MG tablet TAKE 1 MONTHLY  3 tablet  3  . levETIRAcetam (KEPPRA) 500 MG tablet TAKE 1 TABLET BY MOUTH TWICE A DAY  60 tablet  6  . Multiple Vitamin (MULTIVITAMIN) tablet Take 1 tablet by mouth daily.        Marland Kitchen omeprazole (PRILOSEC) 20 MG  capsule Take 20 mg by mouth daily.        . phenazopyridine (PYRIDIUM) 200 MG tablet Take 1 tablet (200 mg total) by mouth 3 (three) times daily as needed for pain.  10 tablet  0  . tiZANidine (ZANAFLEX) 2 MG tablet TAKE 1 TABLET BY MOUTH TWICE A DAY  60 tablet  6  . vitamin E 400 UNIT capsule Take 400 Units by mouth daily.         No facility-administered medications prior to visit.    PAST MEDICAL HISTORY: Past Medical History  Diagnosis Date  . Anemia   . Glaucoma   . Diverticulitis 1995  . Osteoporosis     s/p wrist and shoulder fractures  . Right bundle branch block   . Granulomatous lung disease     on xray  . Personal history of unspecified disease   . Personal history of other  diseases of digestive system   . Type II or unspecified type diabetes mellitus without mention of complication, not stated as uncontrolled   . Osteoarthrosis, unspecified whether generalized or localized, unspecified site   . Unspecified essential hypertension   . External hemorrhoids without mention of complication   . Benign neoplasm of colon     Dr Carlean Purl  . Barrett's esophagus   . Biliary acute pancreatitis 2005  . Hyperlipidemia     NMR lipoprofile 2008: LDL 131 (1260/397), HDL 68 TG 82  . PMR (polymyalgia rheumatica) 02/2011-06/2011  . Gait disturbance   . Other malaise and fatigue 04/27/2013    PAST SURGICAL HISTORY: Past Surgical History  Procedure Laterality Date  . Appendectomy    . Total abdominal hysterectomy w/ bilateral salpingoophorectomy    . Dilation and curettage of uterus    . Cholecystectomy  2003    Dr.Rosenbower  . Scrofula-ectomy    . Colonoscopy w/ polypectomy    . Tonsillectomy    . Orif shoulder fracture  1980  . Back surgery    . Cystectomy      Left Neck  . Wrist fracture surgery    . Esophagogastroduodenoscopy      Multiple EGD's Dr.Gessner  . Esi  06/07/11     [facility]    FAMILY HISTORY: Family History  Problem Relation Age of Onset  . Heart attack Father 9  . Diabetes Mother   . Osteoporosis Mother   . Hip fracture Mother   . Emphysema Mother     Non-smoker  . Heart attack Brother 73  . Cancer Brother     bone  . Emphysema Brother   . Lung cancer Sister   . Cancer Sister     lung  . Bone cancer Other     Aunt-? M or P-Aunt  . Cancer Other     bone  . COPD Brother     SOCIAL HISTORY: History   Social History  . Marital Status: Married    Spouse Name: N/A    Number of Children: 0  . Years of Education: 16   Occupational History  . Retired    Social History Main Topics  . Smoking status: Never Smoker   . Smokeless tobacco: Never Used  . Alcohol Use: No  . Drug Use: No  . Sexual Activity: Not on file   Other  Topics Concern  . Not on file   Social History Narrative   Married    Regular exercise- yes      PHYSICAL EXAM  Filed Vitals:  08/20/13 1443  BP: 180/85  Pulse: 85  Weight: 104 lb (47.174 kg)   Body mass index is 20.99 kg/(m^2).  Generalized: Well developed, in no acute distress    Neurological examination  Mentation: Alert oriented to time, place, history taking. Follows all commands speech and language fluent Cranial nerve II-XII: Extraocular movements were full, visual field were full on confrontational test. Facial sensation and strength were normal.  Motor: The motor testing reveals 5 over 5 strength of all 4 extremities. Good symmetric motor tone is noted throughout.  Sensory: Sensory testing is intact to soft touch on all 4 extremities. No evidence of extinction is noted.  Coordination: Cerebellar testing reveals good finger-nose-finger and heel-to-shin bilaterally.  Gait and station: Gait is unsteady without walker. Uses a walker to ambulate.Tandem gait not attempted.. Romberg is negative. No drift is seen. Long, thick toenails on both feet.  Reflexes: Deep tendon reflexes are symmetric and normal bilaterally.    DIAGNOSTIC DATA (LABS, IMAGING, TESTING) - I reviewed patient records, labs, notes, testing and imaging myself where available.  Lab Results  Component Value Date   WBC 6.3 04/27/2013   HGB 13.1 04/27/2013   HCT 40.1 04/27/2013   MCV 88 04/27/2013   PLT 311 04/27/2013      Component Value Date/Time   NA 144 04/27/2013 1039   NA 142 09/24/2011 1205   K 4.3 04/27/2013 1039   CL 103 04/27/2013 1039   CO2 25 04/27/2013 1039   GLUCOSE 92 04/27/2013 1039   GLUCOSE 84 09/24/2011 1205   BUN 15 04/27/2013 1039   BUN 16 09/24/2011 1205   CREATININE 0.92 04/27/2013 1039   CALCIUM 10.0 04/27/2013 1039   PROT 6.7 04/27/2013 1039   PROT 6.7 06/28/2010 1159   ALBUMIN 4.2 06/28/2010 1159   AST 23 04/27/2013 1039   ALT 15 04/27/2013 1039   ALKPHOS 61  04/27/2013 1039   BILITOT 0.4 04/27/2013 1039   GFRNONAA 55* 04/27/2013 1039   GFRAA 63 04/27/2013 1039   Lab Results  Component Value Date   CHOL 189 06/28/2010   HDL 66.60 06/28/2010   LDLCALC 107* 06/28/2010   TRIG 78.0 06/28/2010   CHOLHDL 3 06/28/2010   Lab Results  Component Value Date   HGBA1C 6.0 08/16/2011   Lab Results  Component Value Date   VITAMINB12 1210* 04/27/2013   Lab Results  Component Value Date   TSH 4.840* 04/27/2013      ASSESSMENT AND PLAN 80 y.o. year old female  has a past medical history of Anemia; Glaucoma; Diverticulitis (1995); Osteoporosis; Right bundle branch block; Granulomatous lung disease; Personal history of unspecified disease; Personal history of other diseases of digestive system; Type II or unspecified type diabetes mellitus without mention of complication, not stated as uncontrolled; Osteoarthrosis, unspecified whether generalized or localized, unspecified site; Unspecified essential hypertension; External hemorrhoids without mention of complication; Benign neoplasm of colon; Barrett's esophagus; Biliary acute pancreatitis (2005); Hyperlipidemia; PMR (polymyalgia rheumatica) (02/2011-06/2011); Gait disturbance; and Other malaise and fatigue (04/27/2013). here with   1. Abnormality of gait 2. Recurrent falls  Patient is having recurrent falls. She is unsafe to live at home and preform activities of daily living without assistance. Spoke at length with the patient regarding transitioning to an assistive living facility. She  is not opposed to having help but wants to discuss it with her husband. Home health has been ordered- including PT, case [employer], [facility]. Would like for PT to help with gait training and maintaining safety  while ambulating in her home. A request for a new rolling walker as been ordered, in the hopes of finding one that will be accommodating for her to use in her home if she chooses to stay there. Patient is without a  primary care provider, explained that if she transited to a assistive living facility those needs would be met for her. She has long thick toenails most likely due to a fungus, this also may interfere with her ambulation. She will be referred to a podiatrist. Patient should follow up in 3-4 months or sooner if needed.     Ward Givens, MSN, NP-C 08/20/2013, 6:11 PM [facility] [address] [place], [place] [address] [phone]  Note: This document was prepared with digital dictation and possible smart phrase technology. Any transcriptional errors that result from this process are unintentional. Kathrynn Ducking

## 2013-08-20 NOTE — Patient Instructions (Signed)

## 2013-08-23 ENCOUNTER — Telehealth: Payer: Self-pay | Admitting: Neurology

## 2013-08-23 NOTE — Telephone Encounter (Signed)
Noted, the patient is in a living situation that is likely unsafe for her, I have recommended that she consider a transition to an extended care facility. She is now relying only aid and assistance from neighbors, no local family members. The patient is falling on a regular basis. I cannot force her to consider this transition period

## 2013-08-23 NOTE — Telephone Encounter (Signed)
Pt calling stating that on her last OV Dr. Jannifer Franklin had some recommendations for the pt and pt just wanted to make Dr. Jannifer Franklin aware that she wanted to hold off on the recommendations until pt speaks with family members. FYI

## 2013-08-23 NOTE — Telephone Encounter (Signed)
Patient called to state that when she saw Dr. Jannifer Franklin for last office visit he had some recommendations for her, patient states she would like to hold off on all recommendations until she speaks with her family members. If questions, please call.

## 2013-08-25 ENCOUNTER — Encounter: Payer: Self-pay | Admitting: Internal Medicine

## 2013-08-25 ENCOUNTER — Ambulatory Visit (INDEPENDENT_AMBULATORY_CARE_PROVIDER_SITE_OTHER): Payer: Medicare Other | Admitting: Internal Medicine

## 2013-08-25 ENCOUNTER — Other Ambulatory Visit (INDEPENDENT_AMBULATORY_CARE_PROVIDER_SITE_OTHER): Payer: Medicare Other

## 2013-08-25 VITALS — BP 160/80 | HR 88 | Temp 98.1°F | Resp 14 | Wt 105.6 lb

## 2013-08-25 DIAGNOSIS — R300 Dysuria: Secondary | ICD-10-CM

## 2013-08-25 DIAGNOSIS — L608 Other nail disorders: Secondary | ICD-10-CM

## 2013-08-25 DIAGNOSIS — R351 Nocturia: Secondary | ICD-10-CM

## 2013-08-25 DIAGNOSIS — R350 Frequency of micturition: Secondary | ICD-10-CM

## 2013-08-25 DIAGNOSIS — R269 Unspecified abnormalities of gait and mobility: Secondary | ICD-10-CM

## 2013-08-25 DIAGNOSIS — L609 Nail disorder, unspecified: Secondary | ICD-10-CM

## 2013-08-25 LAB — URINALYSIS, ROUTINE W REFLEX MICROSCOPIC
BILIRUBIN URINE: NEGATIVE
Hgb urine dipstick: NEGATIVE
Ketones, ur: NEGATIVE
NITRITE: NEGATIVE
SPECIFIC GRAVITY, URINE: 1.015 (ref 1.000–1.030)
Total Protein, Urine: NEGATIVE
Urine Glucose: NEGATIVE
Urobilinogen, UA: 0.2 (ref 0.0–1.0)
pH: 6.5 (ref 5.0–8.0)

## 2013-08-25 MED ORDER — MIRABEGRON ER 25 MG PO TB24: 25.0000 mg | ORAL_TABLET | Freq: Every day | ORAL | Status: DC

## 2013-08-25 NOTE — Progress Notes (Signed)
Pre visit review using our clinic review tool, if applicable. No additional management support is needed unless otherwise documented below in the visit note. 

## 2013-08-25 NOTE — Patient Instructions (Signed)
Natural interventions to treat or prevent constipation would include drinking to thirst, up to 32 ounces of fluids daily; eating 7-9 servings of fresh fruits or vegetables a day; and increasing roughage in the diet such as whole grains. The OTC  fiber products (Example: Metamucil, etc) can be employed if these natural maneuvers do not correct the issue. Finally MiraLax every third day as needed is an option. The Podiatry referral will be scheduled and you'll be notified of the time.

## 2013-08-25 NOTE — Progress Notes (Signed)
Subjective:    Patient ID: Shelley Navarro, female    DOB: [birth year], 80 y.o.   MRN: [id number]  HPI  She has chronic dysuria to some extent as well as frequency and hesitancy. She also has occasional sweats. She states that she voids incompletely. She has recurrent nocturia and returns to the bathroom 10 minutes after voiding multiple times at night.  She has exhibited Klebsiella as well as Escherichia coli urinary tract infections in the recent past.   She has recurrent falls. 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 She has benefited from physical therapy on 3 separate occasions but the benefit is short-lived   Review of Systems  She does not have chills or fever.  She has no pyuria or hematuria.  She also denies flank pain.  There is no neurologic or Cardiologic prodrome prior to her falls except for weakness in her legs. She does ambulate with a rolling walker  She specifically denies any headache or limb numbness/tingling prior to the events. She also has no associated seizure stigmata.  She denies any change in heart rhythm or rate prior to the falls.     Objective:   Physical Exam Gen.: Frail but adequately nourished in appearance. Alert, appropriate and cooperative throughout exam. Appears than stated age  Head: Normocephalic without obvious abnormalities;  Irregular  alopecia  Eyes: No corneal or conjunctival inflammation noted. Pupils equal round reactive to light and accommodation. Extraocular motion intact.  Nose: External nasal exam reveals no deformity or inflammation. Nasal mucosa are pink and moist. No lesions or exudates noted.   Mouth: Oral mucosa and oropharynx reveal no lesions or exudates. Teeth in good repair. Neck: No deformities, masses, or tenderness noted.  Lungs: Normal respiratory effort; chest expands symmetrically. Lungs are clear to auscultation without rales, wheezes, or increased work of breathing. Heart: Normal rate and  rhythm. Normal S1 and S2. No gallop, click, or rub. No murmur.                                  Musculoskeletal/extremities: Markedly accentuated curvature of upper thoracic spine.  No clubbing, cyanosis, or edema noted. Range of motion decreased in all extremities .Tone & strength normal. Hand joints reveal severe mixed arthritic changes of the DIPs and PIP joints. Fingernail health good.  Phenomenally elongated and thickened toenails, worse in the right foot. Vascular: Carotid, radial artery, dorsalis pedis and  posterior tibial pulses are full and equal. No bruits present. Neurologic: Alert and oriented x3. Deep tendon reflexes symmetrical but decreased.   Gait is with rolling walker. She walks bent forward and shuffles her feet.     Skin: Intact without suspicious lesions or rashes. Lymph: No cervical, axillary lymphadenopathy present. Psych: Mood and affect are normal. 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

## 2013-08-27 ENCOUNTER — Other Ambulatory Visit: Payer: Self-pay | Admitting: Internal Medicine

## 2013-08-27 DIAGNOSIS — N390 Urinary tract infection, site not specified: Secondary | ICD-10-CM

## 2013-08-27 LAB — URINE CULTURE

## 2013-08-27 MED ORDER — CIPROFLOXACIN HCL 500 MG PO TABS: 500.0000 mg | ORAL_TABLET | Freq: Two times a day (BID) | ORAL | Status: DC

## 2013-08-31 ENCOUNTER — Telehealth: Payer: Self-pay

## 2013-08-31 ENCOUNTER — Other Ambulatory Visit: Payer: Self-pay | Admitting: Internal Medicine

## 2013-08-31 DIAGNOSIS — N390 Urinary tract infection, site not specified: Secondary | ICD-10-CM

## 2013-08-31 MED ORDER — NITROFURANTOIN MONOHYD MACRO 100 MG PO CAPS: 100.0000 mg | ORAL_CAPSULE | Freq: Two times a day (BID) | ORAL | Status: DC

## 2013-08-31 NOTE — Telephone Encounter (Signed)
Phone call to patient. She has been advised per Dr [name] to only take 1/2 tablet daily of Tizanidine while she is taking Cipro. Once Cipro is completed she can go back to her regular directions.   She states since she's been on the Cipro her tongue is swollen and mouth is dry. Per Dr [name] hold off on taking the Cipro until he can review her chart. Patient understands.

## 2013-08-31 NOTE — Telephone Encounter (Signed)
Nitrofurantoin will eradicate this bacteria; please change to generic Macrobid 100 mg bid #14

## 2013-08-31 NOTE — Addendum Note (Signed)
Addended by: Roma Schanz R on: 08/31/2013 02:10 PM   Modules accepted: Orders, Medications

## 2013-09-03 ENCOUNTER — Telehealth: Payer: Self-pay

## 2013-09-03 NOTE — Telephone Encounter (Signed)
The patient called and is hoping to get a call back from the [facility] explaining her medications   Callback - [phone]

## 2013-09-03 NOTE — Telephone Encounter (Signed)
I called patient back. I explained she is to stop the Cipro and start the new antibiotic macrobid 100 mg and to take 1/2 tab of Tizanidine while on the antibiotic per Dr [name].

## 2013-09-13 ENCOUNTER — Ambulatory Visit (INDEPENDENT_AMBULATORY_CARE_PROVIDER_SITE_OTHER): Payer: Medicare Other | Admitting: Podiatry

## 2013-09-13 ENCOUNTER — Encounter: Payer: Self-pay | Admitting: Podiatry

## 2013-09-13 VITALS — BP 154/88 | HR 80 | Resp 12

## 2013-09-13 DIAGNOSIS — M79609 Pain in unspecified limb: Secondary | ICD-10-CM

## 2013-09-13 DIAGNOSIS — B351 Tinea unguium: Secondary | ICD-10-CM

## 2013-09-13 NOTE — Progress Notes (Signed)
   Subjective:    Patient ID: Shelley Navarro, female    DOB: Feb 18, 1921, 80 y.o.   MRN: [id number]  HPI '' TOENAILS TRIM.''   Review of Systems  Gastrointestinal: Positive for constipation.  Genitourinary: Positive for urgency.  Musculoskeletal: Positive for gait problem.  All other systems reviewed and are negative.      Objective:   Physical Exam        Assessment & Plan:

## 2013-09-13 NOTE — Progress Notes (Signed)
Subjective:     Patient ID: Shelley Navarro, female   DOB: 24-Mar-1921, 80 y.o.   MRN: [id number]  HPI patient presents with severe nail disease 1-5 of both feet with nails that have been neglected and have not been cut for at least a year.   Review of Systems  All other systems reviewed and are negative.      Objective:   Physical Exam  Nursing note and vitals reviewed. Constitutional: She is oriented to person, place, and time.  Cardiovascular: Intact distal pulses.   Musculoskeletal: Normal range of motion.  Neurological: She is oriented to person, place, and time.  Skin: Skin is warm.   neurovascular status intact with muscle strength reduced and range of motion subtalar and midtarsal joint within normal limits. Patient is found to have varicosities in the ankles of both feet and is found to have severely E. long gaited thick and painful nail bed 1-5 both feet     Assessment:     Severe mycotic nail disease 1-5 both feet with pain    Plan:     H&P performed and debridement of nailbeds 1-5 both feet accomplished with no iatrogenic bleeding noted. Reappoint to recheck

## 2013-09-20 ENCOUNTER — Telehealth: Payer: Self-pay | Admitting: Neurology

## 2013-09-20 NOTE — Telephone Encounter (Signed)
I called patient. The patient had another fall at home, bruised some ribs in the back on the right. EMS was called, the patient did not want to go to the emergency room. The patient was given a prescription for a rolling walker, she never got the walker. Home health physical therapy, and a social worker was ordered, they have not heard anything about this, I will find out about this order. The patient needs to be using a walker at all times.

## 2013-09-20 NOTE — Telephone Encounter (Signed)
Patient calling to check the status of new walker being ordered. She fell this am in the bathroom and hit shoulder blade.  EMS came to the home and checked her out and stated she had bruised it pretty bad.  She didn't want to go to hospital, but wanted to make Dr Jannifer Franklin was aware of fall.

## 2013-09-21 ENCOUNTER — Telehealth: Payer: Self-pay | Admitting: *Deleted

## 2013-09-21 NOTE — Telephone Encounter (Signed)
Spoke with Gwinda Passe at [facility]) and she said that she will call the patient to set up the home health

## 2013-09-21 NOTE — Telephone Encounter (Signed)
Called patient to inform that order was sent to [facility] verbalized understanding

## 2013-09-21 NOTE — Telephone Encounter (Signed)
Faxed to [facility] for [facility] PT and walker.

## 2013-09-30 ENCOUNTER — Telehealth: Payer: Self-pay | Admitting: *Deleted

## 2013-09-30 ENCOUNTER — Telehealth: Payer: Self-pay | Admitting: Neurology

## 2013-09-30 NOTE — Telephone Encounter (Signed)
Patient states she is being contacted by two different home heath people for physical therapy and that they were both requested through our office. She is concerned about having all these people coming to her home. States she advised both companies to hold off coming back until she speaks with someone from this office and get a better understanding of what is going on. Please call

## 2013-09-30 NOTE — Telephone Encounter (Signed)
Pt's information was given to Sandy, RN. Please advise °

## 2013-09-30 NOTE — Telephone Encounter (Signed)
I called pt and relayed that if she has seen [facility] PT then to continue with them.  I will call [facility] and  cancel.  I called and spoke to April at [facility] and she relayed that it was cancelled.  I had spoke th Barnabas Lister, PT about this yesterday.  Pt understands.

## 2013-09-30 NOTE — Telephone Encounter (Signed)
Noted  

## 2013-10-14 ENCOUNTER — Observation Stay (HOSPITAL_COMMUNITY)
Admission: EM | Admit: 2013-10-14 | Discharge: 2013-10-16 | Disposition: A | Discharge status: Home Health | Payer: Medicare Other | Attending: Internal Medicine | Admitting: Internal Medicine

## 2013-10-14 ENCOUNTER — Encounter (HOSPITAL_COMMUNITY): Payer: Self-pay | Admitting: Emergency Medicine

## 2013-10-14 ENCOUNTER — Emergency Department (HOSPITAL_COMMUNITY): Payer: Medicare Other

## 2013-10-14 DIAGNOSIS — E785 Hyperlipidemia, unspecified: Secondary | ICD-10-CM | POA: Insufficient documentation

## 2013-10-14 DIAGNOSIS — M353 Polymyalgia rheumatica: Secondary | ICD-10-CM

## 2013-10-14 DIAGNOSIS — W19XXXA Unspecified fall, initial encounter: Secondary | ICD-10-CM | POA: Insufficient documentation

## 2013-10-14 DIAGNOSIS — M6281 Muscle weakness (generalized): Principal | ICD-10-CM | POA: Insufficient documentation

## 2013-10-14 DIAGNOSIS — Z8719 Personal history of other diseases of the digestive system: Secondary | ICD-10-CM

## 2013-10-14 DIAGNOSIS — R946 Abnormal results of thyroid function studies: Secondary | ICD-10-CM | POA: Insufficient documentation

## 2013-10-14 DIAGNOSIS — R7301 Impaired fasting glucose: Secondary | ICD-10-CM | POA: Diagnosis present

## 2013-10-14 DIAGNOSIS — Z9181 History of falling: Secondary | ICD-10-CM | POA: Insufficient documentation

## 2013-10-14 DIAGNOSIS — K644 Residual hemorrhoidal skin tags: Secondary | ICD-10-CM

## 2013-10-14 DIAGNOSIS — R5381 Other malaise: Secondary | ICD-10-CM

## 2013-10-14 DIAGNOSIS — R55 Syncope and collapse: Secondary | ICD-10-CM

## 2013-10-14 DIAGNOSIS — L980 Pyogenic granuloma: Secondary | ICD-10-CM

## 2013-10-14 DIAGNOSIS — E119 Type 2 diabetes mellitus without complications: Secondary | ICD-10-CM

## 2013-10-14 DIAGNOSIS — R531 Weakness: Secondary | ICD-10-CM

## 2013-10-14 DIAGNOSIS — D126 Benign neoplasm of colon, unspecified: Secondary | ICD-10-CM

## 2013-10-14 DIAGNOSIS — R42 Dizziness and giddiness: Secondary | ICD-10-CM

## 2013-10-14 DIAGNOSIS — M1990 Unspecified osteoarthritis, unspecified site: Secondary | ICD-10-CM

## 2013-10-14 DIAGNOSIS — J309 Allergic rhinitis, unspecified: Secondary | ICD-10-CM

## 2013-10-14 DIAGNOSIS — K5900 Constipation, unspecified: Secondary | ICD-10-CM

## 2013-10-14 DIAGNOSIS — D649 Anemia, unspecified: Secondary | ICD-10-CM

## 2013-10-14 DIAGNOSIS — K2270 Barrett's esophagus without dysplasia: Secondary | ICD-10-CM | POA: Insufficient documentation

## 2013-10-14 DIAGNOSIS — R269 Unspecified abnormalities of gait and mobility: Secondary | ICD-10-CM | POA: Insufficient documentation

## 2013-10-14 DIAGNOSIS — M47812 Spondylosis without myelopathy or radiculopathy, cervical region: Secondary | ICD-10-CM

## 2013-10-14 DIAGNOSIS — R7989 Other specified abnormal findings of blood chemistry: Secondary | ICD-10-CM

## 2013-10-14 DIAGNOSIS — D518 Other vitamin B12 deficiency anemias: Secondary | ICD-10-CM

## 2013-10-14 DIAGNOSIS — G40309 Generalized idiopathic epilepsy and epileptic syndromes, not intractable, without status epilepticus: Secondary | ICD-10-CM

## 2013-10-14 DIAGNOSIS — R6889 Other general symptoms and signs: Secondary | ICD-10-CM

## 2013-10-14 DIAGNOSIS — H409 Unspecified glaucoma: Secondary | ICD-10-CM | POA: Insufficient documentation

## 2013-10-14 DIAGNOSIS — Z87898 Personal history of other specified conditions: Secondary | ICD-10-CM

## 2013-10-14 DIAGNOSIS — R5383 Other fatigue: Secondary | ICD-10-CM

## 2013-10-14 DIAGNOSIS — R0602 Shortness of breath: Secondary | ICD-10-CM | POA: Insufficient documentation

## 2013-10-14 DIAGNOSIS — Y92009 Unspecified place in unspecified non-institutional (private) residence as the place of occurrence of the external cause: Secondary | ICD-10-CM | POA: Insufficient documentation

## 2013-10-14 DIAGNOSIS — N390 Urinary tract infection, site not specified: Secondary | ICD-10-CM

## 2013-10-14 DIAGNOSIS — I4510 Unspecified right bundle-branch block: Secondary | ICD-10-CM | POA: Insufficient documentation

## 2013-10-14 DIAGNOSIS — R51 Headache: Secondary | ICD-10-CM

## 2013-10-14 DIAGNOSIS — M810 Age-related osteoporosis without current pathological fracture: Secondary | ICD-10-CM | POA: Insufficient documentation

## 2013-10-14 DIAGNOSIS — S139XXA Sprain of joints and ligaments of unspecified parts of neck, initial encounter: Secondary | ICD-10-CM

## 2013-10-14 DIAGNOSIS — I10 Essential (primary) hypertension: Secondary | ICD-10-CM | POA: Insufficient documentation

## 2013-10-14 HISTORY — DX: Other specified postprocedural states: Z98.890

## 2013-10-14 HISTORY — DX: Other specified postprocedural states: R11.2

## 2013-10-14 LAB — URINALYSIS, ROUTINE W REFLEX MICROSCOPIC
BILIRUBIN URINE: NEGATIVE
Glucose, UA: NEGATIVE mg/dL
HGB URINE DIPSTICK: NEGATIVE
Ketones, ur: NEGATIVE mg/dL
Leukocytes, UA: NEGATIVE
Nitrite: NEGATIVE
PH: 7.5 (ref 5.0–8.0)
Protein, ur: NEGATIVE mg/dL
Specific Gravity, Urine: 1.007 (ref 1.005–1.030)
Urobilinogen, UA: 0.2 mg/dL (ref 0.0–1.0)

## 2013-10-14 LAB — BASIC METABOLIC PANEL
BUN: 15 mg/dL (ref 6–23)
CHLORIDE: 104 meq/L (ref 96–112)
CO2: 25 mEq/L (ref 19–32)
Calcium: 10.2 mg/dL (ref 8.4–10.5)
Creatinine, Ser: 0.89 mg/dL (ref 0.50–1.10)
GFR calc Af Amer: 64 mL/min — ABNORMAL LOW (ref >90)
GFR calc non Af Amer: 55 mL/min — ABNORMAL LOW (ref >90)
GLUCOSE: 121 mg/dL — AB (ref 70–99)
POTASSIUM: 4.4 meq/L (ref 3.7–5.3)
Sodium: 142 mEq/L (ref 137–147)

## 2013-10-14 LAB — CBC
HCT: 39 % (ref 36.0–46.0)
Hemoglobin: 12.9 g/dL (ref 12.0–15.0)
MCH: 29.2 pg (ref 26.0–34.0)
MCHC: 33.1 g/dL (ref 30.0–36.0)
MCV: 88.2 fL (ref 78.0–100.0)
Platelets: 264 10*3/uL (ref 150–400)
RBC: 4.42 MIL/uL (ref 3.87–5.11)
RDW: 13.1 % (ref 11.5–15.5)
WBC: 5.9 10*3/uL (ref 4.0–10.5)

## 2013-10-14 LAB — TROPONIN I: Troponin I: <0.3 ng/mL (ref <0.30)

## 2013-10-14 MED ORDER — ADULT MULTIVITAMIN W/MINERALS CH
1.0000 | ORAL_TABLET | Freq: Every day | ORAL | Status: DC
  Administered 2013-10-15 – 2013-10-16 (×2): 1 via ORAL
  Filled 2013-10-14 (×2): qty 1

## 2013-10-14 MED ORDER — ONDANSETRON HCL 4 MG PO TABS: 4.0000 mg | ORAL_TABLET | Freq: Four times a day (QID) | ORAL | Status: DC | PRN

## 2013-10-14 MED ORDER — VITAMIN B-1 100 MG PO TABS
100.0000 mg | ORAL_TABLET | Freq: Every day | ORAL | Status: DC
  Administered 2013-10-15 – 2013-10-16 (×2): 100 mg via ORAL
  Filled 2013-10-14 (×2): qty 1

## 2013-10-14 MED ORDER — FLEET ENEMA 7-19 GM/118ML RE ENEM: 1.0000 | ENEMA | Freq: Once | RECTAL | Status: AC | PRN

## 2013-10-14 MED ORDER — DOCUSATE SODIUM 100 MG PO CAPS
100.0000 mg | ORAL_CAPSULE | Freq: Two times a day (BID) | ORAL | Status: DC
  Administered 2013-10-14 – 2013-10-16 (×4): 100 mg via ORAL
  Filled 2013-10-14 (×5): qty 1

## 2013-10-14 MED ORDER — SODIUM CHLORIDE 0.9 % IV SOLN
INTRAVENOUS | Status: DC
  Administered 2013-10-14 – 2013-10-15 (×2): via INTRAVENOUS

## 2013-10-14 MED ORDER — FOLIC ACID 1 MG PO TABS
1.0000 mg | ORAL_TABLET | Freq: Every day | ORAL | Status: DC
  Administered 2013-10-15 – 2013-10-16 (×2): 1 mg via ORAL
  Filled 2013-10-14 (×2): qty 1

## 2013-10-14 MED ORDER — SODIUM CHLORIDE 0.9 % IV BOLUS (SEPSIS)
500.0000 mL | Freq: Once | INTRAVENOUS | Status: AC
  Administered 2013-10-14: 500 mL via INTRAVENOUS

## 2013-10-14 MED ORDER — ALUM & MAG HYDROXIDE-SIMETH 200-200-20 MG/5ML PO SUSP: 30.0000 mL | Freq: Four times a day (QID) | ORAL | Status: DC | PRN

## 2013-10-14 MED ORDER — SODIUM CHLORIDE 0.9 % IJ SOLN
3.0000 mL | Freq: Two times a day (BID) | INTRAMUSCULAR | Status: DC
  Administered 2013-10-15: 3 mL via INTRAVENOUS

## 2013-10-14 MED ORDER — ONDANSETRON HCL 4 MG/2ML IJ SOLN: 4.0000 mg | Freq: Four times a day (QID) | INTRAMUSCULAR | Status: DC | PRN

## 2013-10-14 MED ORDER — BISACODYL 10 MG RE SUPP: 10.0000 mg | Freq: Every day | RECTAL | Status: DC | PRN

## 2013-10-14 MED ORDER — HEPARIN SODIUM (PORCINE) 5000 UNIT/ML IJ SOLN
5000.0000 [IU] | Freq: Three times a day (TID) | INTRAMUSCULAR | Status: DC
  Administered 2013-10-14 – 2013-10-16 (×5): 5000 [IU] via SUBCUTANEOUS
  Filled 2013-10-14 (×8): qty 1

## 2013-10-14 NOTE — ED Notes (Signed)
Bed: WA18 Expected date:  Expected time:  Means of arrival:  Comments: EMS-fall 

## 2013-10-14 NOTE — ED Notes (Signed)
Pt transported to XR.  

## 2013-10-14 NOTE — H&P (Signed)
[facility] History and Physical  Shelley Navarro JHE:[id number] DOB: 05-Dec-1920 DOA: 10/14/2013  Referring physician: Alfonzo Beers, MD PCP: Unice Cobble, MD   Chief Complaint: Syncope  HPI: Shelley Navarro is a 80 y.o. female presents to the ED after having had a syncopal episodes. Patient states that she had felt weak prior to the episode. She states that she was not able to stand and her legs gave out from under her. She states that she did appear to black out. She was not even able to stand with the help of a support. She states that now she does seem to be feeling a little stronger. Patient states that she has no fevers no chills. She did not have any chest pain noted. She states there is no abdominal complaints. She had no focal deficits. She states that she did not have any seizure noted. At this time she is awake and alert and is well oriented to place person and time.    Review of Systems:  Constitutional:  No weight loss, night sweats, Fevers, chills, ++weakness HEENT:  No headaches  Cardio-vascular:  No chest pain, Orthopnea, PND, swelling in lower extremities, ++dizziness, no palpitations  GI:  No heartburn, indigestion, abdominal pain, nausea, vomiting Resp:  No shortness of breath with exertion or at rest. Skin:  no rash or lesions.  GU:  no dysuria, change in color of urine, no urgency or frequency. Musculoskeletal:  No joint pain or swelling. No decreased range of motion. No back pain.  Psych:  No change in mood or affect. No depression or anxiety. No memory loss.  Past Medical History  Diagnosis Date  . Anemia   . Glaucoma   . Diverticulitis 1995  . Osteoporosis     s/p wrist and shoulder fractures  . Right bundle branch block   . Granulomatous lung disease     on xray  . Personal history of unspecified disease   . Personal history of other diseases of digestive system   . Type II or unspecified type diabetes mellitus without mention  of complication, not stated as uncontrolled   . Osteoarthrosis, unspecified whether generalized or localized, unspecified site   . Unspecified essential hypertension   . External hemorrhoids without mention of complication   . Benign neoplasm of colon     Dr Carlean Purl  . Barrett's esophagus   . Biliary acute pancreatitis 2005  . Hyperlipidemia     NMR lipoprofile 2008: LDL 131 (1260/397), HDL 68 TG 82  . PMR (polymyalgia rheumatica) 02/2011-06/2011  . Gait disturbance   . Other malaise and fatigue 04/27/2013  . PONV (postoperative nausea and vomiting)    Past Surgical History  Procedure Laterality Date  . Appendectomy    . Total abdominal hysterectomy w/ bilateral salpingoophorectomy    . Dilation and curettage of uterus    . Cholecystectomy  2003    Dr.Rosenbower  . Scrofula-ectomy    . Colonoscopy w/ polypectomy    . Tonsillectomy    . Orif shoulder fracture  1980  . Back surgery    . Cystectomy      Left Neck  . Wrist fracture surgery    . Esophagogastroduodenoscopy      Multiple EGD's Dr.Gessner  . Esi  06/07/11     [facility]   Social History:  reports that she has never smoked. She has never used smokeless tobacco. She reports that she does not drink alcohol or use illicit drugs.  Allergies  Allergen Reactions  .  Sulfonamide Derivatives     hallucinations  . Ciprofloxacin     Telephone 08/31/13 tongue swelling    Family History  Problem Relation Age of Onset  . Heart attack Father 81  . Diabetes Mother   . Osteoporosis Mother   . Hip fracture Mother   . Emphysema Mother     Non-smoker  . Heart attack Brother 55  . Cancer Brother     bone  . Emphysema Brother   . Lung cancer Sister   . Cancer Sister     lung  . Bone cancer Other     Aunt-? M or P-Aunt  . Cancer Other     bone  . COPD Brother      Prior to Admission medications   Medication Sig Start Date End Date Taking? Authorizing Provider  acetaminophen (TYLENOL) 500 MG tablet Take 500 mg by  mouth every 6 (six) hours as needed for moderate pain.   Yes Historical Provider, MD  benazepril (LOTENSIN) 20 MG tablet Take 20 mg by mouth daily.   Yes Historical Provider, MD  bimatoprost (LUMIGAN) 0.03 % ophthalmic drops Place 1 drop into both eyes at bedtime.    Yes Historical Provider, MD  calcium-vitamin D (OSCAL WITH D) 500-200 MG-UNIT per tablet Take 1 tablet by mouth daily.     Yes Historical Provider, MD  Cholecalciferol (VITAMIN D3) 1000 UNITS CAPS Take 1,000 Units by mouth daily.    Yes Historical Provider, MD  Ferrous Sulfate (IRON) 325 (65 FE) MG TABS Take 325 mg by mouth daily.     Yes Historical Provider, MD  ibandronate (BONIVA) 150 MG tablet Take 150 mg by mouth every 30 (thirty) days. Take in the morning with a full glass of water, on an empty stomach, and do not take anything else by mouth or lie down for the next 30 min.   Yes Historical Provider, MD  levETIRAcetam (KEPPRA) 500 MG tablet Take 500 mg by mouth 2 (two) times daily.   Yes Historical Provider, MD  mirabegron ER (MYRBETRIQ) 25 MG TB24 tablet Take 1 tablet (25 mg total) by mouth daily. 08/25/13  Yes Hendricks Limes, MD  Multiple Vitamin (MULTIVITAMIN) tablet Take 1 tablet by mouth daily.     Yes Historical Provider, MD  omeprazole (PRILOSEC) 20 MG capsule Take 20 mg by mouth daily.     Yes Historical Provider, MD  tiZANidine (ZANAFLEX) 2 MG tablet Take 2 mg by mouth 2 (two) times daily.   Yes Historical Provider, MD  vitamin E 400 UNIT capsule Take 400 Units by mouth daily.     Yes Historical Provider, MD   Physical Exam: Filed Vitals:   10/14/13 1753  BP: 178/79  Pulse: 78  Temp: 98.3 F (36.8 C)  Resp: 16    BP 178/79  Pulse 78  Temp(Src) 98.3 F (36.8 C) (Oral)  Resp 16  SpO2 93%  General:  Appears calm and comfortable Eyes: PERRL, normal lids, irises & conjunctiva ENT: grossly normal hearing, lips & tongue Neck: no LAD, masses or thyromegaly Cardiovascular: RRR, no m/r/g. No LE  edema. Respiratory: CTA bilaterally, no w/r/r. Normal respiratory effort. Abdomen: soft, ntnd Skin: no rash or induration seen on limited exam Musculoskeletal: grossly normal tone BUE/BLE Psychiatric: grossly normal mood and affect, speech fluent and appropriate Neurologic: grossly non-focal. Gait was not checked          Labs on Admission:  Basic Metabolic Panel:  Recent Labs Lab 10/14/13 1835  NA 142  K 4.4  CL 104  CO2 25  GLUCOSE 121*  BUN 15  CREATININE 0.89  CALCIUM 10.2   Liver Function Tests: No results found for this basename: AST, ALT, ALKPHOS, BILITOT, PROT, ALBUMIN,  in the last 168 hours No results found for this basename: LIPASE, AMYLASE,  in the last 168 hours No results found for this basename: AMMONIA,  in the last 168 hours CBC:  Recent Labs Lab 10/14/13 1835  WBC 5.9  HGB 12.9  HCT 39.0  MCV 88.2  PLT 264   Cardiac Enzymes:  Recent Labs Lab 10/14/13 1835  TROPONINI <0.30    BNP (last 3 results) No results found for this basename: PROBNP,  in the last 8760 hours CBG: No results found for this basename: GLUCAP,  in the last 168 hours  Radiological Exams on Admission: Dg Chest 2 View  10/14/2013   CLINICAL DATA:  Fall today, weakness.  EXAM: CHEST  2 VIEW  COMPARISON:  Chest x-ray 09/25/2012.  FINDINGS: Cardiomegaly persists. No focal consolidation. No pleural effusion or pneumothorax. 10 mm calcified granuloma left lower lung zone persists. Moderate degenerative change left shoulder.  IMPRESSION: No active cardiopulmonary disease.  Stable exam.   Electronically Signed   By: Rolla Flatten M.D.   On: 10/14/2013 20:38    EKG: Independently reviewed. NSR  Assessment/Plan Principal Problem:   Syncope and collapse Active Problems:   DM w/o Complication Type II   HYPERLIPIDEMIA   HYPERTENSION, ESSENTIAL NOS   Weakness   Syncope   1. Syncope -her history suggests more likely near syncope but she also states that she blacked out at the  same time -will hydrate her she had been outdoors for most of the day so may be dehydrated -will get an echo -will also get a carotid doppler  2. DM type II -glucose looks good  -will get A1C -carb modified diet -she has been diet controlled  3. Hyperlipidemia -will check lipid panel  4. Hypertension -pressure was noted to be on high side -will continue with her home medications    Code Status: Full Code dictate code status--if unknown or must be presumed, indicate so) Family Communication: Husband (indicate person spoken with, if applicable, with phone number if by telephone) Disposition Plan: Home (indicate anticipated LOS)  Time spent: 62min  Dewitt Judice A [facility] Pager [phone]  **Disclaimer: This note may have been dictated with voice recognition software. Similar sounding words can inadvertently be transcribed and this note may contain transcription errors which may not have been corrected upon publication of note.**

## 2013-10-14 NOTE — ED Notes (Signed)
Spouse at bedside

## 2013-10-14 NOTE — ED Notes (Signed)
Pt presented by EMS from home, report of un witnessed fall in the bedroom as a result of weakness, pt found sitted on the floor denies pain, head or neck impact. Unable to stand by herself, endorses weakness at this time states onset this afternoon. Pt in NAD at [facility] time. AOx4.

## 2013-10-14 NOTE — ED Provider Notes (Signed)
CSN: [id number]     Arrival date & time 10/14/13  1747 History   First MD Initiated Contact with Patient 10/14/13 1757     Chief Complaint  Patient presents with  . Weakness  . Fall     (Consider location/radiation/quality/duration/timing/severity/associated sxs/prior Treatment) HPI Pt with hx of multiple similar falls in the past presents after feeling weak and falling.  She states she had just finished physical therapy which she has been doing due to another fall.  She states she got up to go to the bathroom and felt that she was very weak and then fell to the floor.  She denies LOC, but felt weak all over.  No chest pain or palpitations.  No vertigo.  Denies recent fever/chills.  States she ate normally today.  She denies any pain after the fall, did not strike her head.  States she has seen her doctor and neurologist, Dr. Evelena Leyden multiple times due to prior falls.  There are no other associated systemic symptoms, there are no other alleviating or modifying factors.  Past Medical History  Diagnosis Date  . Anemia   . Glaucoma   . Diverticulitis 1995  . Osteoporosis     s/p wrist and shoulder fractures  . Right bundle branch block   . Granulomatous lung disease     on xray  . Personal history of unspecified disease   . Personal history of other diseases of digestive system   . Type II or unspecified type diabetes mellitus without mention of complication, not stated as uncontrolled   . Osteoarthrosis, unspecified whether generalized or localized, unspecified site   . Unspecified essential hypertension   . External hemorrhoids without mention of complication   . Benign neoplasm of colon     Dr Carlean Purl  . Barrett's esophagus   . Biliary acute pancreatitis 2005  . Hyperlipidemia     NMR lipoprofile 2008: LDL 131 (1260/397), HDL 68 TG 82  . PMR (polymyalgia rheumatica) 02/2011-06/2011  . Gait disturbance   . Other malaise and fatigue 04/27/2013  . PONV (postoperative nausea and  vomiting)    Past Surgical History  Procedure Laterality Date  . Appendectomy    . Total abdominal hysterectomy w/ bilateral salpingoophorectomy    . Dilation and curettage of uterus    . Cholecystectomy  2003    Dr.Rosenbower  . Scrofula-ectomy    . Colonoscopy w/ polypectomy    . Tonsillectomy    . Orif shoulder fracture  1980  . Back surgery    . Cystectomy      Left Neck  . Wrist fracture surgery    . Esophagogastroduodenoscopy      Multiple EGD's Dr.Gessner  . Esi  06/07/11     [facility]   Family History  Problem Relation Age of Onset  . Heart attack Father 31  . Diabetes Mother   . Osteoporosis Mother   . Hip fracture Mother   . Emphysema Mother     Non-smoker  . Heart attack Brother 53  . Cancer Brother     bone  . Emphysema Brother   . Lung cancer Sister   . Cancer Sister     lung  . Bone cancer Other     Aunt-? M or P-Aunt  . Cancer Other     bone  . COPD Brother    History  Substance Use Topics  . Smoking status: Never Smoker   . Smokeless tobacco: Never Used  . Alcohol Use: No  OB History   Grav Para Term Preterm Abortions TAB SAB Ect Mult Living                 Review of Systems ROS reviewed and all otherwise negative except for mentioned in HPI    Allergies  Sulfonamide derivatives and Ciprofloxacin  Home Medications   Prior to Admission medications   Medication Sig Start Date End Date Taking? Authorizing Provider  acetaminophen (TYLENOL) 500 MG tablet Take 500 mg by mouth every 6 (six) hours as needed for moderate pain.   Yes Historical Provider, MD  benazepril (LOTENSIN) 20 MG tablet Take 20 mg by mouth daily.   Yes Historical Provider, MD  bimatoprost (LUMIGAN) 0.03 % ophthalmic drops Place 1 drop into both eyes at bedtime.    Yes Historical Provider, MD  calcium-vitamin D (OSCAL WITH D) 500-200 MG-UNIT per tablet Take 1 tablet by mouth daily.     Yes Historical Provider, MD  Cholecalciferol (VITAMIN D3) 1000 UNITS CAPS Take 1,000  Units by mouth daily.    Yes Historical Provider, MD  Ferrous Sulfate (IRON) 325 (65 FE) MG TABS Take 325 mg by mouth daily.     Yes Historical Provider, MD  ibandronate (BONIVA) 150 MG tablet Take 150 mg by mouth every 30 (thirty) days. Take in the morning with a full glass of water, on an empty stomach, and do not take anything else by mouth or lie down for the next 30 min.   Yes Historical Provider, MD  levETIRAcetam (KEPPRA) 500 MG tablet Take 500 mg by mouth 2 (two) times daily.   Yes Historical Provider, MD  mirabegron ER (MYRBETRIQ) 25 MG TB24 tablet Take 1 tablet (25 mg total) by mouth daily. 08/25/13  Yes Hendricks Limes, MD  Multiple Vitamin (MULTIVITAMIN) tablet Take 1 tablet by mouth daily.     Yes Historical Provider, MD  omeprazole (PRILOSEC) 20 MG capsule Take 20 mg by mouth daily.     Yes Historical Provider, MD  tiZANidine (ZANAFLEX) 2 MG tablet Take 2 mg by mouth 2 (two) times daily.   Yes Historical Provider, MD  vitamin E 400 UNIT capsule Take 400 Units by mouth daily.     Yes Historical Provider, MD   BP 170/64  Pulse 69  Temp(Src) 98 F (36.7 C) (Oral)  Resp 20  Ht 4\' 10"  (1.473 m)  Wt 99 lb 6.8 oz (45.1 kg)  BMI 20.79 kg/m2  SpO2 97% Vitals reviewed Physical Exam Physical Examination: General appearance - alert, frail appearing, and in no distress Mental status - alert, oriented to person, place, and time Eyes - pupils equal and reactive, extraocular eye movements intact Mouth - mucous membranes moist, pharynx normal without lesions Chest - clear to auscultation, no wheezes, rales or rhonchi, symmetric air entry Heart - normal rate, regular rhythm, normal S1, S2, no murmurs, rubs, clicks or gallops Abdomen - soft, nontender, nondistended, no masses or organomegaly Neurological - alert, oriented x 3, cranial nerves 2-12 tested and intact, strength 5/5 in extremities x 4, sensation intact Extremities - peripheral pulses normal, no pedal edema, no clubbing or  cyanosis Skin - normal coloration and turgor, no rashes  ED Course  Procedures (including critical care time)  8:31 PM pt continues to feel very weak.  She was unable to stand unassisted when attempted by ED staff.  Not able to ambulate.  Also began to feel some shortness of breath, so CXR ordered.   9:06 PM d/w [facility] for admission.  Labs Review Labs Reviewed  BASIC METABOLIC PANEL - Abnormal; Notable for the following:    Glucose, Bld 121 (*)    GFR calc non Af Amer 55 (*)    GFR calc Af Amer 64 (*)    All other components within normal limits  CBC  TROPONIN I  URINALYSIS, ROUTINE W REFLEX MICROSCOPIC  TSH  TROPONIN I  TROPONIN I  TROPONIN I  HEMOGLOBIN A1C  COMPREHENSIVE METABOLIC PANEL  CBC  PROTIME-INR  LIPID PANEL    Imaging Review Dg Chest 2 View  10/14/2013   CLINICAL DATA:  Fall today, weakness.  EXAM: CHEST  2 VIEW  COMPARISON:  Chest x-ray 09/25/2012.  FINDINGS: Cardiomegaly persists. No focal consolidation. No pleural effusion or pneumothorax. 10 mm calcified granuloma left lower lung zone persists. Moderate degenerative change left shoulder.  IMPRESSION: No active cardiopulmonary disease.  Stable exam.   Electronically Signed   By: Rolla Flatten M.D.   On: 10/14/2013 20:38     EKG Interpretation   Date/Time:  Thursday October 14 2013 18:12:35 EDT Ventricular Rate:  79 PR Interval:  146 QRS Duration: 140 QT Interval:  415 QTC Calculation: 476 R Axis:   -106 Text Interpretation:  Sinus rhythm Nonspecific IVCD with LAD No  significant change since last tracing Confirmed by Canary Brim  MD, MARTHA  [phone]) on 10/14/2013 11:26:59 PM      MDM   Final diagnoses:  Syncope and collapse    Pt presenting after weakness resulting in fall at home.  She denies LOC, but also states that "everything went black'  She has hx of similar episodes and has seen neurology for this- thought to maybe related to seizure activity.  Pt not able to ambulate or even stand without  assistance in the ED.  Admitted to [facility] for further workup and management.  Pt treated with IV fluids, other labs reassuring.     Threasa Beards, MD 10/14/13 2329

## 2013-10-15 DIAGNOSIS — I10 Essential (primary) hypertension: Secondary | ICD-10-CM

## 2013-10-15 DIAGNOSIS — Y92009 Unspecified place in unspecified non-institutional (private) residence as the place of occurrence of the external cause: Secondary | ICD-10-CM

## 2013-10-15 DIAGNOSIS — W19XXXA Unspecified fall, initial encounter: Secondary | ICD-10-CM

## 2013-10-15 DIAGNOSIS — I059 Rheumatic mitral valve disease, unspecified: Secondary | ICD-10-CM

## 2013-10-15 DIAGNOSIS — R7989 Other specified abnormal findings of blood chemistry: Secondary | ICD-10-CM | POA: Diagnosis present

## 2013-10-15 DIAGNOSIS — E119 Type 2 diabetes mellitus without complications: Secondary | ICD-10-CM

## 2013-10-15 LAB — CBC
HCT: 37.4 % (ref 36.0–46.0)
HEMOGLOBIN: 12.1 g/dL (ref 12.0–15.0)
MCH: 28.9 pg (ref 26.0–34.0)
MCHC: 32.4 g/dL (ref 30.0–36.0)
MCV: 89.3 fL (ref 78.0–100.0)
PLATELETS: 237 10*3/uL (ref 150–400)
RBC: 4.19 MIL/uL (ref 3.87–5.11)
RDW: 13.1 % (ref 11.5–15.5)
WBC: 5.7 10*3/uL (ref 4.0–10.5)

## 2013-10-15 LAB — COMPREHENSIVE METABOLIC PANEL
ALT: 16 U/L (ref 0–35)
AST: 21 U/L (ref 0–37)
Albumin: 3.3 g/dL — ABNORMAL LOW (ref 3.5–5.2)
Alkaline Phosphatase: 72 U/L (ref 39–117)
BUN: 11 mg/dL (ref 6–23)
CALCIUM: 9.2 mg/dL (ref 8.4–10.5)
CO2: 25 mEq/L (ref 19–32)
CREATININE: 0.79 mg/dL (ref 0.50–1.10)
Chloride: 110 mEq/L (ref 96–112)
GFR calc Af Amer: 82 mL/min — ABNORMAL LOW (ref >90)
GFR, EST NON AFRICAN AMERICAN: 71 mL/min — AB (ref >90)
GLUCOSE: 101 mg/dL — AB (ref 70–99)
Potassium: 4 mEq/L (ref 3.7–5.3)
Sodium: 146 mEq/L (ref 137–147)
Total Bilirubin: 0.4 mg/dL (ref 0.3–1.2)
Total Protein: 5.9 g/dL — ABNORMAL LOW (ref 6.0–8.3)

## 2013-10-15 LAB — PROTIME-INR
INR: 1.05 (ref 0.00–1.49)
PROTHROMBIN TIME: 13.5 s (ref 11.6–15.2)

## 2013-10-15 LAB — LIPID PANEL
Cholesterol: 138 mg/dL (ref 0–200)
HDL: 53 mg/dL (ref >39)
LDL CALC: 70 mg/dL (ref 0–99)
TRIGLYCERIDES: 76 mg/dL (ref <150)
Total CHOL/HDL Ratio: 2.6 RATIO
VLDL: 15 mg/dL (ref 0–40)

## 2013-10-15 LAB — TSH: TSH: 6.43 u[IU]/mL — AB (ref 0.350–4.500)

## 2013-10-15 LAB — HEMOGLOBIN A1C
Hgb A1c MFr Bld: 5.8 % — ABNORMAL HIGH (ref <5.7)
Mean Plasma Glucose: 120 mg/dL — ABNORMAL HIGH (ref <117)

## 2013-10-15 LAB — TROPONIN I: Troponin I: <0.3 ng/mL (ref <0.30)

## 2013-10-15 LAB — GLUCOSE, CAPILLARY: GLUCOSE-CAPILLARY: 86 mg/dL (ref 70–99)

## 2013-10-15 MED ORDER — PANTOPRAZOLE SODIUM 40 MG PO TBEC
40.0000 mg | DELAYED_RELEASE_TABLET | Freq: Every day | ORAL | Status: DC
  Administered 2013-10-15 – 2013-10-16 (×2): 40 mg via ORAL
  Filled 2013-10-15 (×2): qty 1

## 2013-10-15 MED ORDER — TIZANIDINE HCL 2 MG PO TABS
2.0000 mg | ORAL_TABLET | Freq: Two times a day (BID) | ORAL | Status: DC
  Administered 2013-10-15 – 2013-10-16 (×2): 2 mg via ORAL
  Filled 2013-10-15 (×3): qty 1

## 2013-10-15 MED ORDER — LEVETIRACETAM 500 MG PO TABS
500.0000 mg | ORAL_TABLET | Freq: Two times a day (BID) | ORAL | Status: DC
  Administered 2013-10-15 – 2013-10-16 (×2): 500 mg via ORAL
  Filled 2013-10-15 (×3): qty 1

## 2013-10-15 MED ORDER — MIRABEGRON ER 25 MG PO TB24
25.0000 mg | ORAL_TABLET | Freq: Every day | ORAL | Status: DC
  Administered 2013-10-15 – 2013-10-16 (×2): 25 mg via ORAL
  Filled 2013-10-15 (×2): qty 1

## 2013-10-15 MED ORDER — BENAZEPRIL HCL 20 MG PO TABS
20.0000 mg | ORAL_TABLET | Freq: Every day | ORAL | Status: DC
  Administered 2013-10-16: 20 mg via ORAL
  Filled 2013-10-15 (×2): qty 1

## 2013-10-15 MED ORDER — ACETAMINOPHEN 325 MG PO TABS
650.0000 mg | ORAL_TABLET | Freq: Four times a day (QID) | ORAL | Status: DC | PRN
  Administered 2013-10-15: 650 mg via ORAL
  Filled 2013-10-15: qty 2

## 2013-10-15 NOTE — Progress Notes (Signed)
Patient is active with [facility] for HHPT as prior to admission; Attending MD at discharge please order HHPT for resumption of services. Mindi Slicker RN,BSN,MHA [phone]

## 2013-10-15 NOTE — Evaluation (Signed)
Physical Therapy Evaluation Patient Details Name: Shelley Navarro MRN: [id number] DOB: 1920-12-26 Today's Date: 10/15/2013   History of Present Illness  Pt came in due to feeling weak and having her legs give way on her. Stated she is still active with HHPT , however they were with her and were just about to "graduate" DC from their services until this occured.   Clinical Impression  Pt presents with decreased strength and ability with mobility. Feel pt would benefit from increased mobility with staff here in hospital and continue to assess, treat and educate with PT to increase ability to return back home at [place] level with husband safely. Pt has little awareness of her deficits for she stated she would be fine to DC home right now, and she would not be safe at the level of which I assessed her at this time of eval.     Follow Up Recommendations Home health PT (pt current with [facility] service (including PT) so encourage these to continue at this time. )    Equipment Recommendations  None recommended by PT    Recommendations for Other Services       Precautions / Restrictions Precautions Precautions: Fall Restrictions Weight Bearing Restrictions: No      Mobility  Bed Mobility Overal bed mobility: Needs Assistance Bed Mobility: Supine to Sit     Supine to sit: Mod assist     General bed mobility comments: assist needed to scoot hips from middle of bed to edge of bed for sitting EOB  Transfers Overall transfer level: Needs assistance Equipment used: Rolling walker (2 wheeled) Transfers: Sit to/from Stand Sit to Stand: Min assist         General transfer comment: cues for hand placment and safety adn assit for intial posterior lean and to regain balance  Ambulation/Gait Ambulation/Gait assistance: Min assist Ambulation Distance (Feet): 10 Feet (then perfromed a second time about 20 feet) Assistive device: Rolling walker (2 wheeled) Gait Pattern/deviations:  Step-to pattern     General Gait Details: small steps, small base of support, difficultly initiatiating movement and steps. First time up, pt with posterior leana dn one time LOB posteriorly with Mod A to help recover. Then second time up with verbal cues and tactile cures to intiate steps , pt was able to improve gait and them at end of 20 feet had gained more speed and balance.    Stairs            Wheelchair Mobility    Modified Rankin (Stroke Patients Only)       Balance                                             Pertinent Vitals/Pain Reports no pain    Home Living Family/patient expects to be discharged to:: Private residence Living Arrangements: Spouse/significant other Available Help at Discharge: Family (just husband, no other family or friends) Type of Home: House Home Access: Stairs to enter Entrance Stairs-Rails: None (states she can reach teh railing on the front porch) Technical brewer of Steps: [place]: One [place]: Walker - 2 wheels      Prior Function Level of Independence: Independent with assistive device(s)         Comments: states she uses the RW in the house and outside, but has trouble in spots in her house where  the RW doesn't fit     Hand Dominance        Extremity/Trunk Assessment               Lower Extremity Assessment: Generalized weakness         Communication   Communication: No difficulties  Cognition Arousal/Alertness: Awake/alert Behavior During Therapy: WFL for tasks assessed/performed Overall Cognitive Status: Within Functional Limits for tasks assessed                      General Comments      Exercises        Assessment/Plan    PT Assessment Patient needs continued PT services  PT Diagnosis Difficulty walking   PT Problem List Decreased strength;Decreased activity tolerance;Decreased balance;Decreased mobility  PT Treatment Interventions DME  instruction;Gait training;Stair training;Functional mobility training;Therapeutic activities;Therapeutic exercise   PT Goals (Current goals can be found in the Care Plan section) Acute Rehab PT Goals Patient Stated Goal: to return home PT Goal Formulation: With patient Time For Goal Achievement: 10/29/13    Frequency Min 3X/week   Barriers to discharge        Co-evaluation               End of Session Equipment Utilized During Treatment: Gait belt Activity Tolerance: Patient tolerated treatment well Patient left: in chair;with call bell/phone within reach;with family/visitor present Nurse Communication: Mobility status (educated nursing to try to mobilize pt with RW to /fro chair and bathroom tonight to increase pt back on her feet. )    Functional Assessment Tool Used: clincial judgement Functional Limitation: Mobility: Walking and moving around Mobility: Walking and Moving Around Current Status [phone]): At least 20 percent but less than 40 percent impaired, limited or restricted Mobility: Walking and Moving Around Goal Status [id number]): At least 1 percent but less than 20 percent impaired, limited or restricted    Time: 1515-1550 PT Time Calculation (min): 35 min   Charges:   PT Evaluation $Initial PT Evaluation Tier I: 1 Procedure PT Treatments $Gait Training: 8-22 mins $Therapeutic Activity: 8-22 mins   PT G Codes:   Functional Assessment Tool Used: clincial judgement Functional Limitation: Mobility: Walking and moving around    [place], [facility] 10/15/2013, 5:44 PM Clide Dales, PT Pager: [phone] 10/15/2013

## 2013-10-15 NOTE — Progress Notes (Signed)
UR completed 

## 2013-10-15 NOTE — Progress Notes (Signed)
Echocardiogram 2D Echocardiogram has been performed.  Malayna Noori 10/15/2013, 9:46 AM

## 2013-10-15 NOTE — Progress Notes (Addendum)
[facility] Progress Note   Shelley Navarro YNW:[id number] DOB: 01/19/1921 DOA: 10/14/2013 PCP: Unice Cobble, MD  Brief narrative: Shelley Navarro is a 80 y.o. female presenting after a fall at home- she confirms there was no syncope. She is currently recovering home healthy PT for falls. 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 On Keppra.    Subjective: Feels well. She states now that she did not feel weak yesterday but was "backing up" an thought the her chair was behind her but realized that it wasn't. She did not pass out this time.   Assessment/Plan: Principal Problem:   Fall at home - cont PT- recommendations by PT are to keep overnight and allow her to ambulate an couple more times before letting her go home tomorrow  Active Problems:    Elevated TSH - will repeat TSH and add free T4.      DM w/o Complication Type II - sugars stable    HYPERLIPIDEMIA - cholesterol controlled    HYPERTENSION, ESSENTIAL NOS - BP high - outpt treatment     Code Status:  Family Communication: with husband Disposition Plan: home with HHPT  Consultants: none  Procedures: none  Antibiotics: Antibiotics Given (last 72 hours)   None       DVT prophylaxis: Heparin  Objective: Filed Weights   10/14/13 2217  Weight: 45.1 kg (99 lb 6.8 oz)    Vitals Filed Vitals:   10/14/13 2000 10/14/13 2217 10/15/13 0453 10/15/13 1433  BP: 170/67 170/64 144/78 151/73  Pulse:  69 65 84  Temp:  98 F (36.7 C) 97.4 F (36.3 C) 97.7 F (36.5 C)  TempSrc:  Oral Oral Oral  Resp: 17 20 18 18   Height:  4\' 10"  (1.473 m)    Weight:  45.1 kg (99 lb 6.8 oz)    SpO2:  97% 98% 97%     Intake/Output Summary (Last 24 hours) at 10/15/13 1544 Last data filed at 10/15/13 1500  Gross per 24 hour  Intake 900.83 ml  Output   1600 ml  Net -699.17 ml     Exam: General: No acute respiratory distress-  frail elderly lady sitting up- severe kyphosis Lungs: Clear to auscultation bilaterally without wheezes or crackles Cardiovascular: Regular rate and rhythm without murmur gallop or rub normal S1 and S2 Abdomen: Nontender, nondistended, soft, bowel sounds positive, no rebound, no ascites, no appreciable mass Extremities: No significant cyanosis, clubbing, or edema bilateral lower extremities  Data Reviewed: Basic Metabolic Panel:  Recent Labs Lab 10/14/13 1835 10/15/13 0415  NA 142 146  K 4.4 4.0  CL 104 110  CO2 25 25  GLUCOSE 121* 101*  BUN 15 11  CREATININE 0.89 0.79  CALCIUM 10.2 9.2   Liver Function Tests:  Recent Labs Lab 10/15/13 0415  AST 21  ALT 16  ALKPHOS 72  BILITOT 0.4  PROT 5.9*  ALBUMIN 3.3*   No results found for this basename: LIPASE, AMYLASE,  in the last 168 hours No results found for this basename: AMMONIA,  in the last 168 hours CBC:  Recent Labs Lab 10/14/13 1835 10/15/13 0415  WBC 5.9 5.7  HGB 12.9 12.1  HCT 39.0 37.4  MCV 88.2 89.3  PLT 264 237   Cardiac Enzymes:  Recent Labs Lab 10/14/13 1835 10/15/13 0030 10/15/13 0415  TROPONINI <0.30 <0.30 <0.30   BNP (last 3 results) No results found for this basename: PROBNP,  in the last 8760 hours CBG:  Recent Labs Lab 10/15/13 0754  GLUCAP 86    No results found for this or any previous visit (from the past 240 hour(s)).   Studies:  Recent x-ray studies have been reviewed in detail by the Attending Physician  Scheduled Meds:  Scheduled Meds: . docusate sodium  100 mg Oral BID  . folic acid  1 mg Oral Daily  . heparin  5,000 Units Subcutaneous 3 times per day  . multivitamin with minerals  1 tablet Oral Daily  . sodium chloride  3 mL Intravenous Q12H  . thiamine  100 mg Oral Daily   Continuous Infusions: . sodium chloride 50 mL/hr at 10/14/13 2235    Time spent on care of this patient: 35 min   [place], MD 10/15/2013, 3:44 PM  LOS: 1 day   [facility] Office  [phone] Pager - Text Page per Shea Evans   If 7PM-7AM, please contact night-coverage [email or web address]

## 2013-10-16 LAB — GLUCOSE, CAPILLARY: Glucose-Capillary: 88 mg/dL (ref 70–99)

## 2013-10-16 LAB — T4, FREE: Free T4: 1.31 ng/dL (ref 0.80–1.80)

## 2013-10-16 LAB — TSH: TSH: 2.71 u[IU]/mL (ref 0.350–4.500)

## 2013-10-16 NOTE — Progress Notes (Signed)
I have went over d/c instructions with pt and niece Lyndee Leo. Both verbalized understanding of d/c instructions. Out via wheelchair to POV.

## 2013-10-16 NOTE — Progress Notes (Signed)
613/2015 1615 NCM contacted [facility] and she is active with HH. Spoke to oncall [facility] RN and she was active with PT, OT and SW. Faxed orders, facesheet and discharge summary to [facility]. Jonnie Finner RN CCM Case Mgmt phone [phone]

## 2013-10-16 NOTE — Progress Notes (Signed)
CARE MANAGEMENT NOTE 10/16/2013  Patient:  Shelley Navarro, Shelley Navarro   Account Number:  [id number]  Date Initiated:  10/16/2013  Documentation initiated by:  [facility]  Subjective/Objective Assessment:   fall     Action/Plan:   [facility]   Anticipated DC Date:  10/16/2013   Anticipated DC Plan:  [place]  CM consult      [facility] Choice  HOME HEALTH   Choice offered to / List presented to:  C-1 Patient        [place] arranged  [place] PT      [facility] agency  [place]   Status of service:  Completed, signed off Medicare Important Message given?  NA - LOS <3 / Initial given by admissions (If response is "NO", the following Medicare IM given date fields will be blank) Date Medicare IM given:   Date Additional Medicare IM given:    Discharge Disposition:  [place]  Per UR Regulation:    If discussed at Long Length of Stay Meetings, dates discussed:    Comments:  10/16/2013 1500 NCM spoke to pt and offered choice for [facility]. Pt states she had [facility] for [facility]. Will follow up with [facility] for [facility] and fax resumption of care orders to agency. Jonnie Finner RN CCM Case Mgmt phone [phone]

## 2013-10-16 NOTE — Discharge Summary (Signed)
DISCHARGE SUMMARY  Shelley Navarro  MR#: [id number]  DOB:[birth year]  Date of Admission: 10/14/2013 Date of Discharge: 10/16/2013  Attending Physician:Shelley Navarro  Patient's Shelley [name], MD  Consults:  none  Presenting Complaint: Fall at home  Discharge Diagnoses: Principal Problem:   Fall at home Active Problems:   DM w/o Complication Type II   HYPERLIPIDEMIA   HYPERTENSION, ESSENTIAL NOS   Elevated TSH    Discharge Medications:   Medication List         acetaminophen 500 MG tablet  Commonly known as:  TYLENOL  Take 500 mg by mouth every 6 (six) hours as needed for moderate pain.     benazepril 20 MG tablet  Commonly known as:  LOTENSIN  Take 20 mg by mouth daily.     bimatoprost 0.03 % ophthalmic solution  Commonly known as:  LUMIGAN  Place 1 drop into both eyes at bedtime.     calcium-vitamin D 500-200 MG-UNIT per tablet  Commonly known as:  OSCAL WITH D  Take 1 tablet by mouth daily.     ibandronate 150 MG tablet  Commonly known as:  BONIVA  Take 150 mg by mouth every 30 (thirty) days. Take in the morning with a full glass of water, on an empty stomach, and do not take anything else by mouth or lie down for the next 30 min.     Iron 325 (65 FE) MG Tabs  Take 325 mg by mouth daily.     levETIRAcetam 500 MG tablet  Commonly known as:  KEPPRA  Take 500 mg by mouth 2 (two) times daily.     mirabegron ER 25 MG Tb24 tablet  Commonly known as:  MYRBETRIQ  Take 1 tablet (25 mg total) by mouth daily.     multivitamin tablet  Take 1 tablet by mouth daily.     omeprazole 20 MG capsule  Commonly known as:  PRILOSEC  Take 20 mg by mouth daily.     tiZANidine 2 MG tablet  Commonly known as:  ZANAFLEX  Take 2 mg by mouth 2 (two) times daily.     Vitamin D3 1000 UNITS Caps  Take 1,000 Units by mouth daily.     vitamin E 400 UNIT capsule  Take 400 Units by mouth daily.         Procedures: Dg Chest 2 View  10/14/2013   CLINICAL DATA:   Fall today, weakness.  EXAM: CHEST  2 VIEW  COMPARISON:  Chest x-ray 09/25/2012.  FINDINGS: Cardiomegaly persists. No focal consolidation. No pleural effusion or pneumothorax. 10 mm calcified granuloma left lower lung zone persists. Moderate degenerative change left shoulder.  IMPRESSION: No active cardiopulmonary disease.  Stable exam.   Electronically Signed   By: Shelley Navarro M.D.   On: 10/14/2013 20:38    echocardiogram  Hospital Course: Principal Problem:   Fall at home Active Problems:   DM w/o Complication Type II   HYPERLIPIDEMIA   HYPERTENSION, ESSENTIAL NOS   Elevated TSH  Shelley Navarro is a 80 y.o. female presenting after a fall at home- she confirms there was no syncope. She is currently recovering home healthy PT for falls. 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 On Keppra.   Principal Problem:  Fall at home  - initially suspected to be syncope or near syncope but she and her husband confirmed to me that it was only a fall - cont PT-  recommendations by PT are resume home health PT  Active Problems:  Elevated TSH  - normal epeat TSH and add free T4.   DM w/o Complication Type II  - sugars stable   HYPERLIPIDEMIA  - cholesterol controlled   HYPERTENSION, ESSENTIAL NOS  - BP high  - cont outpt treatment        Day of Discharge Physical Exam: Filed Vitals:   10/15/13 0453 10/15/13 1433 10/15/13 2212 10/16/13 0523  BP: 144/78 151/73 160/56 151/61  Pulse: 65 84 68 60  Temp: 97.4 F (36.3 C) 97.7 F (36.5 C) 97.3 F (36.3 C) 97.6 F (36.4 C)  TempSrc: Oral Oral Oral Oral  Resp: 18 18 20 24   Height:      Weight:      SpO2: 98% 97% 99% 94%       Results for orders placed during the hospital encounter of 10/14/13 (from the past 24 hour(s))  TSH     Status: None   Collection Time    10/16/13  4:53 AM      Result Value Ref Range   TSH 2.710  0.350 - 4.500 uIU/mL   GLUCOSE, CAPILLARY     Status: None   Collection Time    10/16/13  7:57 AM      Result Value Ref Range   Glucose-Capillary 88  70 - 99 mg/dL   Comment 1 Documented in Chart     Comment 2 Notify RN      Disposition: stable   Follow-up Appts: Follow-up Information   Follow up with Shelley Cobble, MD. Schedule an appointment as soon as possible for a visit in 1 week.   Specialty:  Internal Medicine   Contact information:   [address] [address] [phone]       Follow up with [place])    Specialty:  Home Health Services   Contact information:   [address] [place] [address] [phone]          Discharge Instructions   Diet - low sodium heart healthy    Complete by:  As directed      Increase activity slowly    Complete by:  As directed             Time on Discharge: > 45 min  Signed: [place] 10/16/2013, 3:27 PM

## 2013-10-16 NOTE — Progress Notes (Signed)
Physical Therapy Treatment Patient Details Name: JACQLYN MAROLF MRN: [id number] DOB: 1920/10/14 Today's Date: 10/16/2013    History of Present Illness Pt came in due to feeling weak and having her legs give way on her. Stated she is still active with HHPT , however they were with her and were just about to "graduate" DC from their services until this occured.     PT Comments    Pt continues to require min A for balance during gait, strongly recommend 24 hour supervision and continued HHPT.  Follow Up Recommendations  Home health PT;Supervision/Assistance - 24 hour     Equipment Recommendations  None recommended by PT    Recommendations for Other Services       Precautions / Restrictions Precautions Precautions: Fall Restrictions Weight Bearing Restrictions: No    Mobility  Bed Mobility   Bed Mobility: Supine to Sit     Supine to sit: Min assist     General bed mobility comments: assist for scooting to edge of bed  Transfers Overall transfer level: Needs assistance Equipment used: Rolling walker (2 wheeled)   Sit to Stand: Min assist         General transfer comment: cues for safety with RW  Ambulation/Gait Ambulation/Gait assistance: Min assist Ambulation Distance (Feet): 80 Feet Assistive device: Rolling walker (2 wheeled)     Gait velocity interpretation: Below normal speed for age/gender General Gait Details: forward flexed posture, decreased step length B, requires min A for balance when stopping/starting walking and with obstacle negotiation   Stairs            Wheelchair Mobility    Modified Rankin (Stroke Patients Only)       Balance                                    Cognition Arousal/Alertness: Awake/alert Behavior During Therapy: WFL for tasks assessed/performed Overall Cognitive Status: Within Functional Limits for tasks assessed                      Exercises      General Comments         Pertinent Vitals/Pain No c/o pain    Home Living                      Prior Function            PT Goals (current goals can now be found in the care plan section) Progress towards PT goals: Progressing toward goals    Frequency  Min 3X/week    PT Plan Current plan remains appropriate    Co-evaluation             End of Session   Activity Tolerance: Patient tolerated treatment well Patient left: in chair;with call bell/phone within reach;with chair alarm set     Time: 0805-0820 PT Time Calculation (min): 15 min  Charges:  $Gait Training: 8-22 mins                    G Codes:  Functional Assessment Tool Used: clincial judgement Functional Limitation: Mobility: Walking and moving around Mobility: Walking and Moving Around Current Status [phone]): At least 20 percent but less than 40 percent impaired, limited or restricted Mobility: Walking and Moving Around Goal Status [phone]): At least 1 percent but less than 20 percent impaired, limited or restricted  Bernard Slayden 10/16/2013, 12:05 PM

## 2013-10-26 ENCOUNTER — Ambulatory Visit: Payer: Medicare Other | Admitting: Neurology

## 2013-10-28 ENCOUNTER — Encounter: Payer: Self-pay | Admitting: Internal Medicine

## 2013-10-28 ENCOUNTER — Ambulatory Visit (INDEPENDENT_AMBULATORY_CARE_PROVIDER_SITE_OTHER): Payer: Medicare Other | Admitting: Internal Medicine

## 2013-10-28 VITALS — BP 132/60 | HR 98 | Temp 98.5°F | Wt 101.8 lb

## 2013-10-28 DIAGNOSIS — Z9181 History of falling: Secondary | ICD-10-CM

## 2013-10-28 DIAGNOSIS — R55 Syncope and collapse: Secondary | ICD-10-CM

## 2013-10-28 DIAGNOSIS — R296 Repeated falls: Secondary | ICD-10-CM

## 2013-10-28 NOTE — Progress Notes (Signed)
   Subjective:    Patient ID: Shelley Navarro, female    DOB: 19-Feb-1921, 80 y.o.   MRN: [id number]  HPI  She was in the hospital 6/11-6/13 following a fall at home. She had just received physical therapy treatment and when she backed up to sit down missed the chair.  She was on telemetry for 48 hours and no significant dysrhythmias were noted  This makes approximately the 18th episode of falling or syncope she's had over the last 2-3 years.  She has been placed on Keppra for possible seizures; this does not appear to have decreased the frequency of events.  Typically these manifest as weakness in the knees followed by blurred vision followed by fall or syncope. She denies any neurologic or cardiac prodrome.  There is no frank seizure stigmata.      Review of Systems   Denied were any change in heart rhythm or rate prior to the events. There was no associated chest pain or shortness of breath .  Specifically denied prior to events were headache or limb tingling or numbness.      Objective:   Physical Exam   She appears very frail. There is profound loss of height; she's been almost doubled over. She ambulates with a rolling walker  There is an S4 with slight slurring but no significant murmur. Rhythm is normal as is rate. There is profound curvature of the thoracic spine  She has severe osteoarthritic changes in hands  Pedal pulses are equal but decreased. She has no significant edema.  She is alert and oriented x3.              Assessment & Plan:  #1 Recurrent falls/Syncope. No subjective decrease in events on Keppra.  Plan: Cardiology referral to rule out need for loop recorder implantation.

## 2013-10-28 NOTE — Progress Notes (Signed)
   Subjective:    Patient ID: Shelley Navarro, female    DOB: [birth year], 80 y.o.   MRN: [id number]  HPI Pt fell at home and was taken to the ER on 10/14/13 where she was kept until 10/16/13. At the time of the fall the pt had been working with home PT that was seeing her due to a history of falls. At the time of her fall on 10/14/13 pt reports she was trying to sit down in a chair and missed the chair. She denies hitting her head. She also denies any syncope or blacking out. She is able to recall the series of events. She had no chest pain, SOB, light headedness or visual disturbances prior to the fall. She does report she gets weak often and has to sit down.   In the past the patient has had weakness and "wooziness" prior to her falls. She sees Dr. 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  Respiratory: Negative for shortness of breath.   Cardiovascular: Negative for chest pain.  Neurological: Negative for light-headedness, numbness and headaches.       No tingling       Objective:   Physical Exam Systolic murmer      Assessment & Plan:  #1 s/p fall;

## 2013-10-28 NOTE — Patient Instructions (Signed)
The Cardiology referral will be scheduled and you'll be notified of the time.

## 2013-10-28 NOTE — Progress Notes (Signed)
Pre visit review using our clinic review tool, if applicable. No additional management support is needed unless otherwise documented below in the visit note. 

## 2013-11-02 ENCOUNTER — Institutional Professional Consult (permissible substitution): Payer: Medicare Other | Admitting: Cardiovascular Disease

## 2013-11-02 ENCOUNTER — Telehealth: Payer: Self-pay | Admitting: Neurology

## 2013-11-02 NOTE — Telephone Encounter (Signed)
Received a message from the OT with [facility] to let the doctor know that the patient had another fall on 10-24-13.  He said she "blacked out" and EMS came to house to check her out.  She was not hurt from the fall.  Looking at her record she saw Dr. [name] on 10-28-13.  The OT just wanted to make the doctor aware.

## 2013-11-02 NOTE — Telephone Encounter (Signed)
Information noted, the patient has had significant issues with multiple falls over the last several months. The home situation is poor, we discussed considering transition to an extended care facility, but the patient is not open to this idea. They are not doing well in the home environment.

## 2013-11-10 ENCOUNTER — Telehealth: Payer: Self-pay | Admitting: Internal Medicine

## 2013-11-10 NOTE — Telephone Encounter (Signed)
Patient Information:  Caller Name: [place]  Phone: [phone]  Patient: Shelley Navarro  Gender: Female  DOB: 03/05/1931  Age: 80 Years  PCP: Unice Cobble  Office Follow Up:  Does the office need to follow up with this patient?: Yes  Instructions For The Office: Pls read RN note.  RN Note:  Urinary Burning w/ Frequency, onset 7-8.  Pt has hx of UTI, last treatment was Jan 2015.  Pt is drinking normally.  All emergent sxs ruled out per Urinary Pain protocol, see today d/t over 21 years of age.  Pt unable to make same day appt d/t time of triage and transit time to office.  Please review w/ MD and f/u w/ Pt if MD wants to start Abx or have Pt drop off Urine at office on 7-9, Per Pt's request.  Symptoms  Reason For Call & Symptoms: Urinary Burning w/ Frequency, onset 7-8  Reviewed Health History In EMR: Yes  Reviewed Medications In EMR: Yes  Reviewed Allergies In EMR: Yes  Reviewed Surgeries / Procedures: Yes  Date of Onset of Symptoms: 11/10/2013  Guideline(s) Used:  Urination Pain - Female  Disposition Per Guideline:   See Today in Office  Reason For Disposition Reached:   Age > 50 years  Advice Given:  N/A  Patient Will Follow Care Advice:  YES

## 2013-11-10 NOTE — Telephone Encounter (Signed)
Work in with NP Student 7/9;she has had resistent UTIs

## 2013-11-11 NOTE — Telephone Encounter (Signed)
Notified pt wirth md response. Made appt for tomorrow @ 11:15...Shelley Navarro

## 2013-11-12 ENCOUNTER — Ambulatory Visit (INDEPENDENT_AMBULATORY_CARE_PROVIDER_SITE_OTHER): Payer: Medicare Other | Admitting: Internal Medicine

## 2013-11-12 ENCOUNTER — Other Ambulatory Visit: Payer: Medicare Other

## 2013-11-12 VITALS — BP 140/80 | Temp 97.7°F | Wt 99.8 lb

## 2013-11-12 DIAGNOSIS — R8290 Unspecified abnormal findings in urine: Secondary | ICD-10-CM

## 2013-11-12 DIAGNOSIS — Z87440 Personal history of urinary (tract) infections: Secondary | ICD-10-CM

## 2013-11-12 DIAGNOSIS — R82998 Other abnormal findings in urine: Secondary | ICD-10-CM

## 2013-11-12 DIAGNOSIS — R300 Dysuria: Secondary | ICD-10-CM

## 2013-11-12 DIAGNOSIS — R350 Frequency of micturition: Secondary | ICD-10-CM

## 2013-11-12 LAB — POCT URINALYSIS DIPSTICK
Bilirubin, UA: NEGATIVE
GLUCOSE UA: NEGATIVE
KETONES UA: NEGATIVE
Nitrite, UA: NEGATIVE
Urobilinogen, UA: 0.2
pH, UA: 5

## 2013-11-12 MED ORDER — AMOXICILLIN-POT CLAVULANATE 500-125 MG PO TABS: ORAL_TABLET | ORAL | Status: DC

## 2013-11-12 MED ORDER — PHENAZOPYRIDINE HCL 200 MG PO TABS: 200.0000 mg | ORAL_TABLET | Freq: Three times a day (TID) | ORAL | Status: DC | PRN

## 2013-11-12 NOTE — Progress Notes (Signed)
   Subjective:    Patient ID: Shelley Navarro, female    DOB: [birth year], 80 y.o.   MRN: [id number]  HPI  As of 11/09/13 she developed incontinence associated with urinary frequency and hesitancy. She also has had dysuria. Nocturia occurs 5-6 times per night  The chart was reviewed; she has had Klebsiella on 2 occasions. On one occasion it was sensitive and one occasion not sensitive to nitrofurantoin. It has been resistant to amoxicillin. She is intolerant or allergic to Cipro and sulfa.    Review of Systems  She denies pyuria, or hematuria.  She has no fever, chills, or sweats.    Objective:   Physical Exam  General appearance :very frail but adequate nourishment w/o distress.  Eyes: No conjunctival inflammation or scleral icterus is present.  Oral exam: Dental hygiene is good; lips and gums are healthy appearing.There is no oropharyngeal erythema or exudate noted.   Heart:  Normal rate and regular rhythm. S1 and S2 normal without gallop, murmur, click, rub or other extra sounds     Lungs:Chest clear to auscultation; no wheezes, rhonchi,rales ,or rubs present.No increased work of breathing.   Abdomen: bowel sounds normal, soft and non-tender without masses, organomegaly or hernias noted.  No guarding or rebound . No tenderness over the flanks to percussion  Musculoskeletal:Rolling walker; bent over walking. Accentuated thoracic curve.Thorax on hips.Limb wasting.  Neuro: Oriented X 3; tremor of tongue & jaw  Skin:Warm & dry.  Intact without suspicious lesions or rashes ; no jaundice .Slight tenting  Lymphatic: No lymphadenopathy is noted about the head, neck, axillary areas.                 Assessment & Plan:  #1 dysuria, hesitancy, incontinence, nocturia  #2 history of Klebsiella urinary tract infections  See orders and recommendations

## 2013-11-12 NOTE — Progress Notes (Signed)
Pre visit review using our clinic review tool, if applicable. No additional management support is needed unless otherwise documented below in the visit note. 

## 2013-11-12 NOTE — Patient Instructions (Signed)
Your next office appointment will be determined based upon review of your pending culture. Those instructions will be transmitted to you by mail Drink as much nondairy fluids as possible. Avoid spicy foods or alcohol as  these may aggravate the bladder. Do not take decongestants. Avoid narcotics if possible.

## 2013-11-14 LAB — URINE CULTURE
COLONY COUNT: NO GROWTH
ORGANISM ID, BACTERIA: NO GROWTH

## 2013-11-19 ENCOUNTER — Ambulatory Visit: Payer: Self-pay | Admitting: Adult Health

## 2013-12-06 ENCOUNTER — Ambulatory Visit: Payer: Medicare Other | Admitting: Adult Health

## 2013-12-06 ENCOUNTER — Telehealth: Payer: Self-pay | Admitting: Adult Health

## 2013-12-06 NOTE — Telephone Encounter (Signed)
Patient no showed for a revisit appointment.  

## 2013-12-07 ENCOUNTER — Other Ambulatory Visit: Payer: Self-pay | Admitting: Internal Medicine

## 2013-12-13 ENCOUNTER — Encounter: Payer: Self-pay | Admitting: Adult Health

## 2013-12-21 ENCOUNTER — Other Ambulatory Visit: Payer: Self-pay | Admitting: Neurology

## 2013-12-22 ENCOUNTER — Ambulatory Visit (INDEPENDENT_AMBULATORY_CARE_PROVIDER_SITE_OTHER): Payer: Medicare Other | Admitting: Adult Health

## 2013-12-22 ENCOUNTER — Encounter: Payer: Self-pay | Admitting: Adult Health

## 2013-12-22 VITALS — BP 163/73 | HR 75 | Ht <= 58 in | Wt 99.5 lb

## 2013-12-22 DIAGNOSIS — R296 Repeated falls: Secondary | ICD-10-CM

## 2013-12-22 DIAGNOSIS — Z9181 History of falling: Secondary | ICD-10-CM

## 2013-12-22 DIAGNOSIS — G40309 Generalized idiopathic epilepsy and epileptic syndromes, not intractable, without status epilepticus: Secondary | ICD-10-CM

## 2013-12-22 MED ORDER — LEVETIRACETAM 500 MG PO TABS: 500.0000 mg | ORAL_TABLET | Freq: Two times a day (BID) | ORAL | Status: DC

## 2013-12-22 NOTE — Progress Notes (Signed)
PATIENT: Shelley Navarro DOB: [birth year]  REASON FOR VISIT: follow up HISTORY FROM: patient  HISTORY OF PRESENT ILLNESS: Shelley Navarro is a 80 year old female with a history of gait disorder and possible seizure events associated with dizziness and confusion. She returns today for follow-up. She is currently taking Keppra and is tolerating it well. She denies any seizures. Patient states that her gait as remained the same. She has physical therapy that comes out twice a week. She has not had any falls in the last month but then states that she fell in July and was in the hospital for 3 days. She continues to live at home with her husband. She feels like she is doing better around her home. She states that she feels her and her husband do well at home and do not want to give that up right now. She is also followed by Dr. [name] who is aware of her frequent falls as well according to the patient.   HISTORY 08/20/13: history of a gait disorder, and a history of presumed seizure events associated with dizziness and confusion. She returns today for follow up. Patient states that she" walks pretty good but she keeps falling." She uses the walker to ambulate. She fell 1 week ago and a total of 5 times this month. Three of those times they had to call EMS to come pick her up. She normally falls when she lets go of her walker. She states that her walker is too wide to go through some of the door frames. When she fell three weeks ago, she thinks she may have hurt her right foot. States it became swollen and started to hurt several days after her fall. She has been to physical therapy three times. Patient is on Keppra and tolerating it well. No reports of seizures. Patient continues to cook but states she gets really tired. She lives at home with her husband. They are unable to clean there house "like it should be." Feels like she and her husband do pretty well on there own. Her neighbors have been taking  them to the grocery store and her appointments. Patient is unable to get in the bathtub or shower. She stands at the sink to take a "wash up." She is unable to get her walker in the bathroom. Up until last year she raised a garden and did a lot of yard work but she no longer does that due to falling.  Patient's neighbor brought them to this visit and asked to speak to me privately. She states that the patient and her husband needs assistance in there home. She states that the house has not been cleaned in years. She states they have "papers" stacked on the couch with a small place open for them to sit. Neighbor reports that she has fallen multiple times this month. She is concerned about there well being.    REVIEW OF SYSTEMS: Full 14 system review of systems performed and notable only for:  Constitutional: N/A  Eyes: N/A Ear/Nose/Throat: N/A  Skin: moles Cardiovascular: leg swelling Respiratory: N/A  Gastrointestinal: Black stools, constipation   Genitourinary: painful urination, incontinence of bladder, and frequency of urination and urgency Hematology/Lymphatic: N/A  Endocrine: N/A Musculoskeletal:N/A  Allergy/Immunology: N/A  Neurological: dizziness Psychiatric: N/A Sleep: insomnia and frequent waking   ALLERGIES: Allergies  Allergen Reactions  . Sulfonamide Derivatives     hallucinations  . Ciprofloxacin     Telephone 08/31/13 tongue swelling    HOME  MEDICATIONS: Outpatient Prescriptions Prior to Visit  Medication Sig Dispense Refill  . acetaminophen (TYLENOL) 500 MG tablet Take 500 mg by mouth every 6 (six) hours as needed for moderate pain.      Marland Kitchen amoxicillin-clavulanate (AUGMENTIN) 500-125 MG per tablet 1 bid post meal  14 tablet  0  . benazepril (LOTENSIN) 20 MG tablet Take 20 mg by mouth daily.      . benazepril (LOTENSIN) 20 MG tablet TAKE 1 TABLET BY MOUTH EVERY DAY  90 tablet  1  . bimatoprost (LUMIGAN) 0.03 % ophthalmic drops Place 1 drop into both eyes at bedtime.        . calcium-vitamin D (OSCAL WITH D) 500-200 MG-UNIT per tablet Take 1 tablet by mouth daily.        . Cholecalciferol (VITAMIN D3) 1000 UNITS CAPS Take 1,000 Units by mouth daily.       . Ferrous Sulfate (IRON) 325 (65 FE) MG TABS Take 325 mg by mouth as needed.       . ibandronate (BONIVA) 150 MG tablet Take 150 mg by mouth every 30 (thirty) days. Take in the morning with a full glass of water, on an empty stomach, and do not take anything else by mouth or lie down for the next 30 min.      . levETIRAcetam (KEPPRA) 500 MG tablet Take 500 mg by mouth 2 (two) times daily.      . Multiple Vitamin (MULTIVITAMIN) tablet Take 1 tablet by mouth daily.        Marland Kitchen omeprazole (PRILOSEC) 20 MG capsule Take 20 mg by mouth daily.        Marland Kitchen tiZANidine (ZANAFLEX) 2 MG tablet Take 2 mg by mouth 2 (two) times daily.      . vitamin E 400 UNIT capsule Take 400 Units by mouth daily.        . phenazopyridine (PYRIDIUM) 200 MG tablet Take 1 tablet (200 mg total) by mouth 3 (three) times daily as needed for pain.  12 tablet  0  . levETIRAcetam (KEPPRA) 500 MG tablet TAKE 1 TABLET BY MOUTH TWICE A DAY  60 tablet  0  . mirabegron ER (MYRBETRIQ) 25 MG TB24 tablet Take 1 tablet (25 mg total) by mouth daily.  30 tablet  1   No facility-administered medications prior to visit.    PAST MEDICAL HISTORY: Past Medical History  Diagnosis Date  . Anemia   . Glaucoma   . Diverticulitis 1995  . Osteoporosis     s/p wrist and shoulder fractures  . Right bundle branch block   . Granulomatous lung disease     on xray  . Personal history of unspecified disease   . Personal history of other diseases of digestive system   . Type II or unspecified type diabetes mellitus without mention of complication, not stated as uncontrolled   . Osteoarthrosis, unspecified whether generalized or localized, unspecified site   . Unspecified essential hypertension   . External hemorrhoids without mention of complication   . Benign neoplasm  of colon     Dr Carlean Purl  . Barrett's esophagus   . Biliary acute pancreatitis 2005  . Hyperlipidemia     NMR lipoprofile 2008: LDL 131 (1260/397), HDL 68 TG 82  . PMR (polymyalgia rheumatica) 02/2011-06/2011  . Gait disturbance   . Other malaise and fatigue 04/27/2013  . PONV (postoperative nausea and vomiting)     PAST SURGICAL HISTORY: Past Surgical History  Procedure Laterality Date  . Appendectomy    .  Total abdominal hysterectomy w/ bilateral salpingoophorectomy    . Dilation and curettage of uterus    . Cholecystectomy  2003    Dr.Rosenbower  . Scrofula-ectomy    . Colonoscopy w/ polypectomy    . Tonsillectomy    . Orif shoulder fracture  1980  . Back surgery    . Cystectomy      Left Neck  . Wrist fracture surgery    . Esophagogastroduodenoscopy      Multiple EGD's Dr.Gessner  . Esi  06/07/11     [facility]    FAMILY HISTORY: Family History  Problem Relation Age of Onset  . Heart attack Father 58  . Diabetes Mother   . Osteoporosis Mother   . Hip fracture Mother   . Emphysema Mother     Non-smoker  . Heart attack Brother 41  . Cancer Brother     bone  . Emphysema Brother   . Lung cancer Sister   . Cancer Sister     lung  . Bone cancer Other     Aunt-? M or P-Aunt  . Cancer Other     bone  . COPD Brother     SOCIAL HISTORY: History   Social History  . Marital Status: Married    Spouse Name: N/A    Number of Children: 0  . Years of Education: 16   Occupational History  . Retired    Social History Main Topics  . Smoking status: Never Smoker   . Smokeless tobacco: Never Used  . Alcohol Use: No  . Drug Use: No  . Sexual Activity: Not on file   Other Topics Concern  . Not on file   Social History Narrative   Patient is married with no children.   Patient is right handed.   Patient has 16 yrs of education.   Patient drinks 2 cups daily.            PHYSICAL EXAM  Filed Vitals:   12/22/13 1145  BP: 163/73  Pulse: 75  Height:  4\' 10"  (1.473 m)  Weight: 99 lb 8 oz (45.133 kg)   Body mass index is 20.8 kg/(m^2).  Generalized: Well developed, in no acute distress   Neurological examination  Mentation: Alert oriented to time, place, history taking. Follows all commands speech and language fluent Cranial nerve II-XII: . Extraocular movements were full, visual field were full on confrontational test. Facial sensation and strength were normal. Head turning and shoulder shrug  were normal and symmetric. Motor: The motor testing reveals 5 over 5 strength of all 4 extremities. Good symmetric motor tone is noted throughout.  Sensory: Sensory testing is intact to soft touch on all 4 extremities. No evidence of extinction is noted.  Coordination: Cerebellar testing reveals good finger-nose-finger and heel-to-shin bilaterally.  Gait and station: She uses a walker to ambulate. Gait is very cautious and slow. Tandem gait not attempted. Romberg is negative. No drift is seen.  Reflexes: Deep tendon reflexes are symmetric and normal bilaterally.    DIAGNOSTIC DATA (LABS, IMAGING, TESTING) - I reviewed patient records, labs, notes, testing and imaging myself where available.  Lab Results  Component Value Date   WBC 5.7 10/15/2013   HGB 12.1 10/15/2013   HCT 37.4 10/15/2013   MCV 89.3 10/15/2013   PLT 237 10/15/2013      Component Value Date/Time   NA 146 10/15/2013 0415   NA 144 04/27/2013 1039   K 4.0 10/15/2013 0415   CL 110  10/15/2013 0415   CO2 25 10/15/2013 0415   GLUCOSE 101* 10/15/2013 0415   GLUCOSE 92 04/27/2013 1039   BUN 11 10/15/2013 0415   BUN 15 04/27/2013 1039   CREATININE 0.79 10/15/2013 0415   CALCIUM 9.2 10/15/2013 0415   PROT 5.9* 10/15/2013 0415   PROT 6.7 04/27/2013 1039   ALBUMIN 3.3* 10/15/2013 0415   AST 21 10/15/2013 0415   ALT 16 10/15/2013 0415   ALKPHOS 72 10/15/2013 0415   BILITOT 0.4 10/15/2013 0415   GFRNONAA 71* 10/15/2013 0415   GFRAA 82* 10/15/2013 0415   Lab Results  Component Value Date    CHOL 138 10/15/2013   HDL 53 10/15/2013   LDLCALC 70 10/15/2013   TRIG 76 10/15/2013   CHOLHDL 2.6 10/15/2013   Lab Results  Component Value Date   HGBA1C 5.8* 10/15/2013   Lab Results  Component Value Date   VITAMINB12 1210* 04/27/2013   Lab Results  Component Value Date   TSH 2.710 10/16/2013      ASSESSMENT AND PLAN 80 y.o. year old female  has a past medical history of Anemia; Glaucoma; Diverticulitis (1995); Osteoporosis; Right bundle branch block; Granulomatous lung disease; Personal history of unspecified disease; Personal history of other diseases of digestive system; Type II or unspecified type diabetes mellitus without mention of complication, not stated as uncontrolled; Osteoarthrosis, unspecified whether generalized or localized, unspecified site; Unspecified essential hypertension; External hemorrhoids without mention of complication; Benign neoplasm of colon; Barrett's esophagus; Biliary acute pancreatitis (2005); Hyperlipidemia; PMR (polymyalgia rheumatica) (02/2011-06/2011); Gait disturbance; Other malaise and fatigue (04/27/2013); and PONV (postoperative nausea and vomiting). here with:  1. Seizures 2. Frequent falls  The patient denies having any seizure events. She currently taking Keppra and tolerating it well. I will refill this today. Patient continues to have frequent falls although she does state that she has not had a fall in "one month." I have reminded the patient that she should use the walker at all times even inside her home. She lives at home with her husband who is  34. At the last visit and this visit I have mentioned the possibility of them transferring to an independent or assisted living facility. She states that this time she feels her and her husband do well at home and they would like to stay there. The patient should followup in 6 months or sooner if needed.  Ward Givens, MSN, NP-C 12/22/2013, 11:58 AM [facility] [address]  [place], [place] [address] [phone]  Note: This document was prepared with digital dictation and possible smart phrase technology. Any transcriptional errors that result from this process are unintentional.

## 2013-12-22 NOTE — Patient Instructions (Signed)

## 2013-12-22 NOTE — Progress Notes (Signed)
I have read the note, and I agree with the clinical assessment and plan.  Kazuko Clemence KEITH   

## 2013-12-23 ENCOUNTER — Encounter: Payer: Medicare Other | Admitting: Cardiovascular Disease

## 2013-12-23 NOTE — Progress Notes (Signed)
No show

## 2013-12-27 ENCOUNTER — Other Ambulatory Visit: Payer: Self-pay | Admitting: Neurology

## 2013-12-27 NOTE — Telephone Encounter (Signed)
We have been prescribing this med since 2013 per [facility] notes.

## 2014-01-05 ENCOUNTER — Ambulatory Visit (INDEPENDENT_AMBULATORY_CARE_PROVIDER_SITE_OTHER): Payer: Medicare Other | Admitting: Internal Medicine

## 2014-01-05 ENCOUNTER — Encounter: Payer: Self-pay | Admitting: Internal Medicine

## 2014-01-05 VITALS — BP 108/76 | HR 84 | Temp 97.8°F | Wt 101.5 lb

## 2014-01-05 DIAGNOSIS — N3281 Overactive bladder: Secondary | ICD-10-CM

## 2014-01-05 DIAGNOSIS — N390 Urinary tract infection, site not specified: Secondary | ICD-10-CM

## 2014-01-05 DIAGNOSIS — I959 Hypotension, unspecified: Secondary | ICD-10-CM

## 2014-01-05 DIAGNOSIS — N318 Other neuromuscular dysfunction of bladder: Secondary | ICD-10-CM

## 2014-01-05 MED ORDER — MIRABEGRON ER 25 MG PO TB24: 25.0000 mg | ORAL_TABLET | Freq: Every day | ORAL | Status: DC

## 2014-01-05 NOTE — Progress Notes (Signed)
   Subjective:    Patient ID: Shelley Navarro, female    DOB: 1920/07/02, 80 y.o.   MRN: [id number]  HPI   She's had recurrent urinary tract infections remotely as well as in the last year. On 08/25/13 C&S showed Klebsiella pneumoniae . This isolated as well  on 04/20/13 and 04/06/13. Only on 12/16 was the Klebsiella resistant to nitrofurantoin.  She had hallucinations with sulfa in the past. Cipro caused angioedema symptoms. She denies any definite history of genitourinary abnormalities anomalies. She did have cystoscopy apparently in 2000 by Dr. Serita Butcher.  She describes overactive bladder symptoms with frequency and urgency. At times incontinence.    Review of Systems  She denies fever, chills, sweats, flank pain  She has no dysuria, pyuria, or hematuria.    She has had hyperglycemia in the past but her most recent A1c 10/15/13 was 5.8%      Objective:   Physical Exam  Positive or pertinent physical findings include: There is marked curvature of the thoracic spine. She ambulates with a rolling walker. She has dramatic mixed changes of the DIP and PIP joints.  General appearance :adequately nourished; in no distress. Eyes: No conjunctival inflammation or scleral icterus is present. Oral exam: Dental hygiene is good. Lips and gums are healthy appearing.There is no oropharyngeal erythema or exudate noted.  Heart:  Normal rate and regular rhythm. S1 and S2 normal without gallop, murmur, click, rub or other extra sounds   Lungs:Chest clear to auscultation; no wheezes, rhonchi,rales ,or rubs present.No increased work of breathing.  Abdomen: bowel sounds normal, soft and non-tender without masses, organomegaly or hernias noted.  No guarding or rebound. No flank tenderness to percussion. Skin:Warm & dry.  Intact without suspicious lesions or rashes ; no jaundice or tenting Lymphatic: No lymphadenopathy is noted about the head, neck, axilla             Assessment &  Plan:  #1 OAB #2 recurrent UTIs #3 hypotension #4 abnormal glucose with normal A1c See orders & AVS

## 2014-01-05 NOTE — Patient Instructions (Addendum)
Please take the Myrbetriq sample once daily as a trial for your urinary tract symptoms. Decrease BP pill by 1/2 pill daily.

## 2014-01-05 NOTE — Progress Notes (Signed)
   Subjective:    Patient ID: Shelley Navarro, female    DOB: 15-Jan-1921, 80 y.o.   MRN: [id number]  HPI  Patient is seen today to discuss prophylactic treatment of recurring UTI's.  Her last UTI symptoms were in July of 2015, culture at that time did not have growth.   In April of 2015 and in December of 2014, Klebsiella were present.  She is allergic to Cipro and Sulfa and her cultures have shown intermittent resistance to nitrofurantoin.    She currently denies burning or pain with urination (typical UTI symptoms for patient).  She does have chronic urinary incontinence.    She has recently been undergoing physical therapy after several falls in the last 24 months.  This was completed 12/27/12.  The patient has not fallen in over 30 days.    Review of Systems  She does report that she "tires easily"  She denies recent fevers, chills, weight loss or weight gain.   She denies chest pain, shortness of breath, PND.   She denies abdominal pain, nausea, vomiting, or diarrhea.  She has chronic constipation.      Objective:   Physical Exam  Constitutional: She is oriented to person, place, and time. No distress.  Elderly, frail female  Neck: Normal range of motion. Neck supple. No thyromegaly present.  Cardiovascular: Normal rate, regular rhythm and normal heart sounds.   No murmur heard. Pulmonary/Chest: Effort normal and breath sounds normal. No respiratory distress. She has no wheezes.  Abdominal: Soft. Bowel sounds are normal.  Lymphadenopathy:    She has no cervical adenopathy.  Neurological: She is alert and oriented to person, place, and time.  Skin: Skin is warm and dry. She is not diaphoretic.  Psychiatric: She has a normal mood and affect. Her behavior is normal. Judgment and thought content normal.   BP 108/76  Pulse 84  Temp(Src) 97.8 F (36.6 C) (Oral)  Wt 101 lb 8 oz (46.04 kg)  SpO2 97%       Assessment & Plan:

## 2014-01-05 NOTE — Progress Notes (Signed)
Pre visit review using our clinic review tool, if applicable. No additional management support is needed unless otherwise documented below in the visit note. 

## 2014-01-15 ENCOUNTER — Other Ambulatory Visit: Payer: Self-pay | Admitting: Internal Medicine

## 2014-01-18 ENCOUNTER — Encounter: Payer: Self-pay | Admitting: Cardiovascular Disease

## 2014-01-20 ENCOUNTER — Other Ambulatory Visit: Payer: Self-pay | Admitting: Neurology

## 2014-01-21 ENCOUNTER — Telehealth: Payer: Self-pay | Admitting: Family Medicine

## 2014-01-21 ENCOUNTER — Other Ambulatory Visit: Payer: Self-pay | Admitting: Internal Medicine

## 2014-01-21 DIAGNOSIS — N3281 Overactive bladder: Secondary | ICD-10-CM

## 2014-01-21 DIAGNOSIS — N390 Urinary tract infection, site not specified: Secondary | ICD-10-CM

## 2014-01-21 NOTE — Telephone Encounter (Signed)
If your symptoms persist or progress; I would recommend referral to Urology for further assessment

## 2014-01-21 NOTE — Telephone Encounter (Signed)
Patient would like a referral.

## 2014-01-21 NOTE — Telephone Encounter (Signed)
Pt called and said that she is not feeling any better with the meds that she was given.She was just in this week.  She wanted to talk to nurse to see what she should do?    Thank you

## 2014-03-23 ENCOUNTER — Encounter: Payer: Self-pay | Admitting: Neurology

## 2014-03-29 ENCOUNTER — Encounter: Payer: Self-pay | Admitting: Neurology

## 2014-05-23 ENCOUNTER — Other Ambulatory Visit: Payer: Self-pay | Admitting: Neurology

## 2014-05-31 ENCOUNTER — Emergency Department (HOSPITAL_COMMUNITY)
Admission: EM | Admit: 2014-05-31 | Discharge: 2014-05-31 | Disposition: A | Discharge status: Home / Self Care | Payer: Medicare Other | Attending: Emergency Medicine | Admitting: Emergency Medicine

## 2014-05-31 ENCOUNTER — Encounter (HOSPITAL_COMMUNITY): Payer: Self-pay

## 2014-05-31 DIAGNOSIS — M1990 Unspecified osteoarthritis, unspecified site: Secondary | ICD-10-CM | POA: Insufficient documentation

## 2014-05-31 DIAGNOSIS — H409 Unspecified glaucoma: Secondary | ICD-10-CM | POA: Diagnosis not present

## 2014-05-31 DIAGNOSIS — Z79899 Other long term (current) drug therapy: Secondary | ICD-10-CM | POA: Diagnosis not present

## 2014-05-31 DIAGNOSIS — M810 Age-related osteoporosis without current pathological fracture: Secondary | ICD-10-CM | POA: Insufficient documentation

## 2014-05-31 DIAGNOSIS — E119 Type 2 diabetes mellitus without complications: Secondary | ICD-10-CM | POA: Insufficient documentation

## 2014-05-31 DIAGNOSIS — R531 Weakness: Secondary | ICD-10-CM | POA: Diagnosis not present

## 2014-05-31 DIAGNOSIS — Z8719 Personal history of other diseases of the digestive system: Secondary | ICD-10-CM | POA: Diagnosis not present

## 2014-05-31 DIAGNOSIS — I10 Essential (primary) hypertension: Secondary | ICD-10-CM | POA: Diagnosis not present

## 2014-05-31 DIAGNOSIS — E785 Hyperlipidemia, unspecified: Secondary | ICD-10-CM | POA: Insufficient documentation

## 2014-05-31 DIAGNOSIS — D649 Anemia, unspecified: Secondary | ICD-10-CM | POA: Diagnosis not present

## 2014-05-31 LAB — URINALYSIS, ROUTINE W REFLEX MICROSCOPIC
Bilirubin Urine: NEGATIVE
Glucose, UA: NEGATIVE mg/dL
Hgb urine dipstick: NEGATIVE
Ketones, ur: NEGATIVE mg/dL
Leukocytes, UA: NEGATIVE
Nitrite: NEGATIVE
PH: 7.5 (ref 5.0–8.0)
Protein, ur: NEGATIVE mg/dL
Specific Gravity, Urine: 1.009 (ref 1.005–1.030)
Urobilinogen, UA: 1 mg/dL (ref 0.0–1.0)

## 2014-05-31 LAB — COMPREHENSIVE METABOLIC PANEL
ALBUMIN: 4.4 g/dL (ref 3.5–5.2)
ALT: 18 U/L (ref 0–35)
AST: 28 U/L (ref 0–37)
Alkaline Phosphatase: 55 U/L (ref 39–117)
Anion gap: 8 (ref 5–15)
BILIRUBIN TOTAL: 0.8 mg/dL (ref 0.3–1.2)
BUN: 16 mg/dL (ref 6–23)
CO2: 25 mmol/L (ref 19–32)
CREATININE: 0.8 mg/dL (ref 0.50–1.10)
Calcium: 9.6 mg/dL (ref 8.4–10.5)
Chloride: 108 mmol/L (ref 96–112)
GFR calc non Af Amer: 63 mL/min — ABNORMAL LOW (ref >90)
GFR, EST AFRICAN AMERICAN: 72 mL/min — AB (ref >90)
Glucose, Bld: 99 mg/dL (ref 70–99)
Potassium: 3.9 mmol/L (ref 3.5–5.1)
SODIUM: 141 mmol/L (ref 135–145)
TOTAL PROTEIN: 6.8 g/dL (ref 6.0–8.3)

## 2014-05-31 LAB — CBC
HEMATOCRIT: 39.7 % (ref 36.0–46.0)
HEMOGLOBIN: 12.6 g/dL (ref 12.0–15.0)
MCH: 28.6 pg (ref 26.0–34.0)
MCHC: 31.7 g/dL (ref 30.0–36.0)
MCV: 90.2 fL (ref 78.0–100.0)
Platelets: 282 10*3/uL (ref 150–400)
RBC: 4.4 MIL/uL (ref 3.87–5.11)
RDW: 13.2 % (ref 11.5–15.5)
WBC: 5 10*3/uL (ref 4.0–10.5)

## 2014-05-31 MED ORDER — SODIUM CHLORIDE 0.9 % IV SOLN
INTRAVENOUS | Status: DC
  Administered 2014-05-31: 15:00:00 via INTRAVENOUS

## 2014-05-31 MED ORDER — SODIUM CHLORIDE 0.9 % IV BOLUS (SEPSIS)
500.0000 mL | Freq: Once | INTRAVENOUS | Status: AC
  Administered 2014-05-31: 500 mL via INTRAVENOUS

## 2014-05-31 NOTE — ED Provider Notes (Signed)
CSN: [id number]     Arrival date & time 05/31/14  1410 History   First MD Initiated Contact with Patient 05/31/14 1524     Chief Complaint  Patient presents with  . Weakness     (Consider location/radiation/quality/duration/timing/severity/associated sxs/prior Treatment) HPI  Pt presenting with c/o generalized weakness.  She was leaving the house to come to the ED to see her husband who is being discharged from the ED- she had called a taxi for a ride- while she was rushing to put her shoes on she began to feel weak and nearly fell.  No syncope.  No chest pain or shortness of breath.  She states she was very weak and had difficulty putting her shoe on after sitting down.  EMS was called by taxi driver- at present patient states she feels back to her baseline.  There are no other associated systemic symptoms, there are no other alleviating or modifying factors.   Past Medical History  Diagnosis Date  . Anemia   . Glaucoma   . Diverticulitis 1995  . Osteoporosis     s/p wrist and shoulder fractures  . Right bundle branch block   . Granulomatous lung disease     on xray  . Personal history of unspecified disease   . Personal history of other diseases of digestive system   . Type II or unspecified type diabetes mellitus without mention of complication, not stated as uncontrolled   . Osteoarthrosis, unspecified whether generalized or localized, unspecified site   . Unspecified essential hypertension   . External hemorrhoids without mention of complication   . Benign neoplasm of colon     Dr Carlean Purl  . Barrett's esophagus   . Biliary acute pancreatitis 2005  . Hyperlipidemia     NMR lipoprofile 2008: LDL 131 (1260/397), HDL 68 TG 82  . PMR (polymyalgia rheumatica) 02/2011-06/2011  . Gait disturbance   . Other malaise and fatigue 04/27/2013  . PONV (postoperative nausea and vomiting)    Past Surgical History  Procedure Laterality Date  . Appendectomy    . Total abdominal  hysterectomy w/ bilateral salpingoophorectomy    . Dilation and curettage of uterus    . Cholecystectomy  2003    Dr.Rosenbower  . Scrofula-ectomy    . Colonoscopy w/ polypectomy    . Tonsillectomy    . Orif shoulder fracture  1980  . Back surgery    . Cystectomy      Left Neck  . Wrist fracture surgery    . Esophagogastroduodenoscopy      Multiple EGD's Dr.Gessner  . Esi  06/07/11     [facility]  . Cystoscopy      ? 2000; Dr Serita Butcher   Family History  Problem Relation Age of Onset  . Heart attack Father 39  . Diabetes Mother   . Osteoporosis Mother   . Hip fracture Mother   . Emphysema Mother     Non-smoker  . Heart attack Brother 80  . Cancer Brother     bone  . Emphysema Brother   . Lung cancer Sister   . Cancer Sister     lung  . Bone cancer Other     Aunt-? M or P-Aunt  . Cancer Other     bone  . COPD Brother    History  Substance Use Topics  . Smoking status: Never Smoker   . Smokeless tobacco: Never Used  . Alcohol Use: No   OB History    No  data available     Review of Systems  ROS reviewed and all otherwise negative except for mentioned in HPI    Allergies  Sulfonamide derivatives and Ciprofloxacin  Home Medications   Prior to Admission medications   Medication Sig Start Date End Date Taking? Authorizing Provider  acetaminophen (TYLENOL) 500 MG tablet Take 500 mg by mouth every 6 (six) hours as needed for moderate pain.    Historical Provider, MD  benazepril (LOTENSIN) 20 MG tablet TAKE 1 TABLET BY MOUTH EVERY DAY 12/07/13   Hendricks Limes, MD  bimatoprost (LUMIGAN) 0.03 % ophthalmic drops Place 1 drop into both eyes at bedtime.     Historical Provider, MD  calcium-vitamin D (OSCAL WITH D) 500-200 MG-UNIT per tablet Take 1 tablet by mouth daily.      Historical Provider, MD  Cholecalciferol (VITAMIN D3) 1000 UNITS CAPS Take 1,000 Units by mouth daily.     Historical Provider, MD  Ferrous Sulfate (IRON) 325 (65 FE) MG TABS Take 325 mg by  mouth as needed.     Historical Provider, MD  ibandronate (BONIVA) 150 MG tablet Take 150 mg by mouth every 30 (thirty) days. Take in the morning with a full glass of water, on an empty stomach, and do not take anything else by mouth or lie down for the next 30 min.    Historical Provider, MD  ibandronate (BONIVA) 150 MG tablet TAKE 1 TABLET MONTHLY 01/17/14   Hendricks Limes, MD  levETIRAcetam (KEPPRA) 500 MG tablet Take 1 tablet (500 mg total) by mouth 2 (two) times daily. 12/22/13   Ward Givens, NP  levETIRAcetam (KEPPRA) 500 MG tablet TAKE 1 TABLET BY MOUTH TWICE A DAY 05/23/14   Kathrynn Ducking, MD  mirabegron ER (MYRBETRIQ) 25 MG TB24 tablet Take 1 tablet (25 mg total) by mouth daily. 01/05/14   Hendricks Limes, MD  Multiple Vitamin (MULTIVITAMIN) tablet Take 1 tablet by mouth daily.      Historical Provider, MD  omeprazole (PRILOSEC) 20 MG capsule Take 20 mg by mouth daily.      Historical Provider, MD  phenazopyridine (PYRIDIUM) 200 MG tablet Take 200 mg by mouth daily. 11/12/13   Hendricks Limes, MD  tiZANidine (ZANAFLEX) 2 MG tablet TAKE 1 TABLET BY MOUTH TWICE A DAY 12/27/13   Kathrynn Ducking, MD  vitamin E 400 UNIT capsule Take 400 Units by mouth daily.      Historical Provider, MD   BP 166/57 mmHg  Pulse 86  Temp(Src) 98.4 F (36.9 C) (Oral)  Resp 20  SpO2 92%  Vitals reviewed Physical Exam  Physical Examination: General appearance - alert, well appearing, and in no distress Mental status - alert, oriented to person, place, and time Eyes - no conjunctival injection, no scleral icterus Mouth - mucous membranes moist, pharynx normal without lesions Chest - clear to auscultation, no wheezes, rales or rhonchi, symmetric air entry Heart - normal rate, regular rhythm, normal S1, S2, no murmurs, rubs, clicks or gallops Abdomen - soft, nontender, nondistended, no masses or organomegaly Neurological - alert, oriented x 3, normal speech, no focal findings or movement disorder  noted Extremities - peripheral pulses normal, no pedal edema, no clubbing or cyanosis Skin - normal coloration and turgor, no rashes  ED Course  Procedures (including critical care time)   Date: 05/31/2014  Rate: 74  Rhythm: normal sinus rhythm  QRS Axis: left  Intervals: normal  ST/T Wave abnormalities: nonspecific ST/T changes  Conduction Disutrbances:right bundle  branch block and left anterior fascicular block  Narrative Interpretation:   Old EKG Reviewed: no significant change compared to prior ekg of 10/15/13 EKG link not in epic so not able to interpret into [facility] Labs Review Labs Reviewed  COMPREHENSIVE METABOLIC PANEL - Abnormal; Notable for the following:    GFR calc non Af Amer 63 (*)    GFR calc Af Amer 72 (*)    All other components within normal limits  CBC  URINALYSIS, ROUTINE W REFLEX MICROSCOPIC    Imaging Review No results found.   EKG Interpretation None      MDM   Final diagnoses:  Generalized weakness    Pt presents after episode of weakness- no sycnope, no chest pain.  She states she feels back to her baseline.  She was coming to the ED to get her husband who is also a patient here- she is his caregiver at home.  Case management has talked to both of them- they have declined placement.  They have home health set up to see them at their home.  Workup reassuring in the ED today.  Discharged with strict return precautions.  Pt agreeable with plan.    Threasa Beards, MD 06/01/14 1534

## 2014-05-31 NOTE — ED Notes (Signed)
Per EMS, pt from home.  Pt spouse is in room 12.  Pt was trying to get a cab to come get patient.  Pt states she became dizzy and weak.  No prior symptoms to event.  Happened 1 hour ago.  Pt was  Unable to get ride to hospital.  Alert and oriented.  No neuro deficits noted.  Vitals: 172/84, hr 80 irregular, resp 18, 98% ra.  cbg 118.

## 2014-05-31 NOTE — Progress Notes (Addendum)
  CARE MANAGEMENT ED NOTE 05/31/2014  Patient:  Shelley Navarro, Shelley Navarro   Account Number:  [id number]  Date Initiated:  05/31/2014  Documentation initiated by:  Jackelyn Poling  Subjective/Objective Assessment:   81 yr old medicare/bcbs female [place] pt EMS, pt from home.  Pt spouse is in room 12.  Pt was trying to get a cab to come get husband. She became dizzy & weak.  use of walker at home     Subjective/Objective Assessment Detail:   pcp Dr Unice Cobble  Pt states she and husband do not have a good support system 3 nephews - 2 locally (one she did not address but the most reliable one has MR and has worked for [place] [employer] for years) & 1 in [place] Her sister and niece both recently passed within 5 yrs Husband's sister is in [place]  Pt states she had home health via [place] previously set up by her neurologist Dr Jannifer Franklin but husband had not needed home health until 05/31/14 [facility] ED visit.  Pt informed Cm with husband in room with her at 74 that she did not prefer facility placement " I prefer to remain at home"  Reports that she and her husband had been taking care of each other.  Reports services from [facility]- meals on wheels  Reported she has an appointment with Dr [name] on Thursday for a flu shot Cm asked EDP about pt request for flu shot  Pt noted with some labored breathing Cm inquired of use of oxygen at home and pt states she does not use oxygen O2 sat wnl     Action/Plan:   Spoke with pt about her medical issues, today's fall, care of her husband in ED Provided husband with [facility] services Offered pt [facility], Data processing manager duty nursing services and home health [facility]) services HH pending ed eval   Action/Plan Detail:   Anticipated DC Date:       Status Recommendation to Physician:   Result of Recommendation:    Other ED Services  Consult Working [place]  Other  Outpatient Services - Pt will follow up    Choice  offered to / List presented to:  C-1 Patient          Status of service:  Completed, signed off  ED Comments:   ED Comments Detail:   05/31/14 1735 Pt given resources for [facility] to include meals on wheels, senior line, senior centers, and transportation services Provided with [facility] contact number for services arriving for her husband Shelley Navarro v) Preference is to have Dr [name] or willis see if she need further services.  Pt to ride in ambulance home with husband.  Discussed preference of Ed staff to have pcp given flu shot to pt if she is not being admitted to [facility] [place] understanding

## 2014-05-31 NOTE — Discharge Instructions (Signed)
Return to the ED with any concerns including chest pain, difficulty breathing, fainting, decreased level of alertness/lethargy, or any other alarming symptoms

## 2014-05-31 NOTE — Progress Notes (Signed)
ED CM sent a FYI note to Dr [name] via [facility]

## 2014-06-01 NOTE — Progress Notes (Addendum)
[facility] ED CM received a call from [facility] home care staff [phone] about pt with confusion about home services for her.  Cm discussed pt had services with [facility] previously ordered by Dr Jannifer Franklin. On 05/31/14 pt informed pending her ED evaluation she may be eligible but after all labs returned she was not eligible.  Pt was aware that she was not eligible at d/c for home health and if she felt she still need it she needed to consult with pcp.  Her husband has returned to ED with confusion today.  Alvis Lemmings inquired about home health orders for pt but EDP, Linker not available CM called Pcp office, spoke with Edwena Blow and left message for [name] RN to call CM to attempt to obtain home health orders to assist [place] with working with pt. Pending return call  CM noted at 1427 an EPIC return note from Dr [name] stating the pt and husband need to consider retirement placement for their protection. Cm return note updating that pt husband in ED 06/01/14 and request home health orders for pt to include RN & SW 1435 Dr [name] called Cm via mobile number Cm discussed issues with pt and husband and Alvis Lemmings concerns.  Dr [name] wants Alvis Lemmings to send forms to his office for pt 1438 Return call to [place] at [place] to share with her that Dr [name] requesting Alvis Lemmings forms to be sent to office for home health services for this pt Updated her we are still following husband and ED SW involved with husband at ED

## 2014-06-02 ENCOUNTER — Telehealth: Payer: Self-pay | Admitting: Internal Medicine

## 2014-06-02 ENCOUNTER — Ambulatory Visit: Payer: Medicare Other

## 2014-06-02 DIAGNOSIS — Z0279 Encounter for issue of other medical certificate: Secondary | ICD-10-CM

## 2014-06-02 NOTE — Telephone Encounter (Signed)
[facility] called back in and needs some info about meds   Please call [phone] [facility]

## 2014-06-02 NOTE — Telephone Encounter (Signed)
Claiborne Billings called from this home and said that this pt is moving into home today and they need a [facility] faxed over today    Fax [phone] Cell  [phone]- [facility] [facility]

## 2014-06-02 NOTE — Telephone Encounter (Addendum)
Attempted to fax patient's medication list to [phone]. # not valid

## 2014-06-02 NOTE — Telephone Encounter (Signed)
We don't have FL2 forms; facility must FAX it to [place]

## 2014-06-27 ENCOUNTER — Encounter: Payer: Self-pay | Admitting: Adult Health

## 2014-06-27 ENCOUNTER — Ambulatory Visit (INDEPENDENT_AMBULATORY_CARE_PROVIDER_SITE_OTHER): Payer: Medicare Other | Admitting: Adult Health

## 2014-06-27 VITALS — BP 158/80 | HR 81 | Ht <= 58 in | Wt 100.5 lb

## 2014-06-27 DIAGNOSIS — R269 Unspecified abnormalities of gait and mobility: Secondary | ICD-10-CM | POA: Diagnosis not present

## 2014-06-27 DIAGNOSIS — G40309 Generalized idiopathic epilepsy and epileptic syndromes, not intractable, without status epilepticus: Secondary | ICD-10-CM | POA: Diagnosis not present

## 2014-06-27 NOTE — Progress Notes (Signed)
PATIENT: Shelley Navarro DOB: November 13, 1920  REASON FOR VISIT: follow up- seizure, gait disorder HISTORY FROM: patient  HISTORY OF PRESENT ILLNESS: Shelley Navarro is a 81 year old female with a history of gait disorder and possible seizure events associated with dizziness and confusion. She returns today for follow-up. She is currently taking Keppra and is tolerating it well. She denies any seizures. Patient denies any changes in her gait. She states that she had one fall at home when her husband was taken to the hospital. She takes she feel trying to put her shoes on. She states that the taxi that was going to take her to the hospital but after she fell the taxi driver called EMS. Since the last visit the patient's husband was taken to [facility] in the patient has gone there as well. The patient is not happy being there she would like to be at [facility] her husband's  health seems to be declining and he will be staying at [facility]. Since being at [facility] she has had some near falls.  No new medical issues since last seen.                                                                                                                                                                        HISTORY 12/22/13: Shelley Navarro is a 81 year old female with a history of gait disorder and possible seizure events associated with dizziness and confusion. She returns today for follow-up. She is currently taking Keppra and is tolerating it well. She denies any seizures. Patient states that her gait as remained the same. She has physical therapy that comes out twice a week. She has not had any falls in the last month but then states that she fell in July and was in the hospital for 3 days. She continues to live at home with her husband. She feels like she is doing better around her home. She states that she feels her and her husband do well at home and do not want to give that up right now. She is also  followed by Dr. [name] who is aware of her frequent falls as well according to the patient.   HISTORY 08/20/13: history of a gait disorder, and a history of presumed seizure events associated with dizziness and confusion. She returns today for follow up. Patient states that she" walks pretty good but she keeps falling." She uses the walker to ambulate. She fell 1 week ago and a total of 5 times this month. Three of those times they had to call EMS to come pick her up. She normally falls when she lets go of her walker. She states that her walker is too wide to go  through some of the door frames. When she fell three weeks ago, she thinks she may have hurt her right foot. States it became swollen and started to hurt several days after her fall. She has been to physical therapy three times. Patient is on Keppra and tolerating it well. No reports of seizures. Patient continues to cook but states she gets really tired. She lives at home with her husband. They are unable to clean there house "like it should be." Feels like she and her husband do pretty well on there own. Her neighbors have been taking them to the grocery store and her appointments. Patient is unable to get in the bathtub or shower. She stands at the sink to take a "wash up." She is unable to get her walker in the bathroom. Up until last year she raised a garden and did a lot of yard work but she no longer does that due to falling.   Patient's neighbor brought them to this visit and asked to speak to me privately. She states that the patient and her husband needs assistance in there home. She states that the house has not been cleaned in years. She states they have "papers" stacked on the couch with a small place open for them to sit. Neighbor reports that she has fallen multiple times this month. She is concerned about there well being.  REVIEW OF SYSTEMS: Out of a complete 14 system review of symptoms, the patient complains only of the following  symptoms, and all other reviewed systems are negative.  Rectal pain, incontinence of bowels  ALLERGIES: Allergies  Allergen Reactions  . Sulfonamide Derivatives     hallucinations  . Ciprofloxacin     Telephone 08/31/13 tongue swelling    HOME MEDICATIONS: Outpatient Prescriptions Prior to Visit  Medication Sig Dispense Refill  . acetaminophen (TYLENOL) 500 MG tablet Take 500 mg by mouth every 6 (six) hours as needed for moderate pain.    . benazepril (LOTENSIN) 20 MG tablet TAKE 1 TABLET BY MOUTH EVERY DAY 90 tablet 1  . calcium-vitamin D (OSCAL WITH D) 500-200 MG-UNIT per tablet Take 1 tablet by mouth daily.      . Cholecalciferol (VITAMIN D3) 1000 UNITS CAPS Take 1,000 Units by mouth daily.     Marland Kitchen ibandronate (BONIVA) 150 MG tablet Take 150 mg by mouth every 30 (thirty) days. Take in the morning with a full glass of water, on an empty stomach, and do not take anything else by mouth or lie down for the next 30 min.    . levETIRAcetam (KEPPRA) 500 MG tablet TAKE 1 TABLET BY MOUTH TWICE A DAY 60 tablet 1  . Multiple Vitamin (MULTIVITAMIN) tablet Take 1 tablet by mouth daily.      Marland Kitchen omeprazole (PRILOSEC) 20 MG capsule Take 20 mg by mouth daily.      . phenazopyridine (PYRIDIUM) 200 MG tablet Take 200 mg by mouth daily.    Marland Kitchen tiZANidine (ZANAFLEX) 2 MG tablet TAKE 1 TABLET BY MOUTH TWICE A DAY 60 tablet 6  . vitamin E 400 UNIT capsule Take 400 Units by mouth daily.      . bimatoprost (LUMIGAN) 0.03 % ophthalmic drops Place 1 drop into both eyes at bedtime.     . Ferrous Sulfate (IRON) 325 (65 FE) MG TABS Take 325 mg by mouth as needed.     . ibandronate (BONIVA) 150 MG tablet TAKE 1 TABLET MONTHLY (Patient not taking: Reported on 06/27/2014) 3 tablet 3  .  levETIRAcetam (KEPPRA) 500 MG tablet Take 1 tablet (500 mg total) by mouth 2 (two) times daily. (Patient not taking: Reported on 06/27/2014) 60 tablet 3  . mirabegron ER (MYRBETRIQ) 25 MG TB24 tablet Take 1 tablet (25 mg total) by mouth  daily. (Patient not taking: Reported on 06/27/2014) 28 tablet 0   No facility-administered medications prior to visit.    PAST MEDICAL HISTORY: Past Medical History  Diagnosis Date  . Anemia   . Glaucoma   . Diverticulitis 1995  . Osteoporosis     s/p wrist and shoulder fractures  . Right bundle branch block   . Granulomatous lung disease     on xray  . Personal history of unspecified disease   . Personal history of other diseases of digestive system   . Type II or unspecified type diabetes mellitus without mention of complication, not stated as uncontrolled   . Osteoarthrosis, unspecified whether generalized or localized, unspecified site   . Unspecified essential hypertension   . External hemorrhoids without mention of complication   . Benign neoplasm of colon     Dr Carlean Purl  . Barrett's esophagus   . Biliary acute pancreatitis 2005  . Hyperlipidemia     NMR lipoprofile 2008: LDL 131 (1260/397), HDL 68 TG 82  . PMR (polymyalgia rheumatica) 02/2011-06/2011  . Gait disturbance   . Other malaise and fatigue 04/27/2013  . PONV (postoperative nausea and vomiting)     PAST SURGICAL HISTORY: Past Surgical History  Procedure Laterality Date  . Appendectomy    . Total abdominal hysterectomy w/ bilateral salpingoophorectomy    . Dilation and curettage of uterus    . Cholecystectomy  2003    Dr.Rosenbower  . Scrofula-ectomy    . Colonoscopy w/ polypectomy    . Tonsillectomy    . Orif shoulder fracture  1980  . Back surgery    . Cystectomy      Left Neck  . Wrist fracture surgery    . Esophagogastroduodenoscopy      Multiple EGD's Dr.Gessner  . Esi  06/07/11     [facility]  . Cystoscopy      ? 2000; Dr Serita Butcher    FAMILY HISTORY: Family History  Problem Relation Age of Onset  . Heart attack Father 14  . Diabetes Mother   . Osteoporosis Mother   . Hip fracture Mother   . Emphysema Mother     Non-smoker  . Heart attack Brother 36  . Cancer Brother     bone  .  Emphysema Brother   . Lung cancer Sister   . Cancer Sister     lung  . Bone cancer Other     Aunt-? M or P-Aunt  . Cancer Other     bone  . COPD Brother     SOCIAL HISTORY: History   Social History  . Marital Status: Married    Spouse Name: N/A  . Number of Children: 0  . Years of Education: 16   Occupational History  . Retired    Social History Main Topics  . Smoking status: Never Smoker   . Smokeless tobacco: Never Used  . Alcohol Use: No  . Drug Use: No  . Sexual Activity: Not on file   Other Topics Concern  . Not on file   Social History Narrative   Patient is married with no children.   Patient is right handed.   Patient has 16 yrs of education.   Patient drinks 2 cups daily.  PHYSICAL EXAM  Filed Vitals:   06/27/14 1329  BP: 158/80  Pulse: 81  Height: 4\' 10"  (1.473 m)  Weight: 100 lb 8 oz (45.587 kg)   Body mass index is 21.01 kg/(m^2).  Generalized: Well developed, in no acute distress   Neurological examination  Mentation: Alert oriented to time, place, history taking. Follows all commands speech and language fluent Cranial nerve II-XII: Pupils were equal round reactive to light. Extraocular movements were full, visual field were full on confrontational test. Facial sensation and strength were normal. Uvula tongue midline. Head turning and shoulder shrug  were normal and symmetric. Motor: The motor testing reveals 5 over 5 strength of all 4 extremities. Good symmetric motor tone is noted throughout.  Sensory: Sensory testing is intact to soft touch on all 4 extremities. No evidence of extinction is noted.  Coordination: Cerebellar testing reveals good finger-nose-finger and heel-to-shin bilaterally.  Gait and station: Patient is in a wheelchair, she did not bring her walker to ambulate today. Reflexes: Deep tendon reflexes are symmetric and normal bilaterally.  Marland Kitchen   DIAGNOSTIC DATA (LABS, IMAGING, TESTING) - I reviewed patient  records, labs, notes, testing and imaging myself where available.  Lab Results  Component Value Date   WBC 5.0 05/31/2014   HGB 12.6 05/31/2014   HCT 39.7 05/31/2014   MCV 90.2 05/31/2014   PLT 282 05/31/2014      Component Value Date/Time   NA 141 05/31/2014 1438   NA 144 04/27/2013 1039   K 3.9 05/31/2014 1438   CL 108 05/31/2014 1438   CO2 25 05/31/2014 1438   GLUCOSE 99 05/31/2014 1438   GLUCOSE 92 04/27/2013 1039   BUN 16 05/31/2014 1438   BUN 15 04/27/2013 1039   CREATININE 0.80 05/31/2014 1438   CALCIUM 9.6 05/31/2014 1438   PROT 6.8 05/31/2014 1438   PROT 6.7 04/27/2013 1039   ALBUMIN 4.4 05/31/2014 1438   AST 28 05/31/2014 1438   ALT 18 05/31/2014 1438   ALKPHOS 55 05/31/2014 1438   BILITOT 0.8 05/31/2014 1438   GFRNONAA 63* 05/31/2014 1438   GFRAA 72* 05/31/2014 1438       ASSESSMENT AND PLAN 81 y.o. year old female  has a past medical history of Anemia; Glaucoma; Diverticulitis (1995); Osteoporosis; Right bundle branch block; Granulomatous lung disease; Personal history of unspecified disease; Personal history of other diseases of digestive system; Type II or unspecified type diabetes mellitus without mention of complication, not stated as uncontrolled; Osteoarthrosis, unspecified whether generalized or localized, unspecified site; Unspecified essential hypertension; External hemorrhoids without mention of complication; Benign neoplasm of colon; Barrett's esophagus; Biliary acute pancreatitis (2005); Hyperlipidemia; PMR (polymyalgia rheumatica) (02/2011-06/2011); Gait disturbance; Other malaise and fatigue (04/27/2013); and PONV (postoperative nausea and vomiting). here with:  1. Seizures 2. Abnormality of gait  Patient will continue taking Keppra 500 mg twice a day. She denies any seizure events or confusional events associated with dizziness. Patient is not happy that she has been moved to [facility]. I have tried to encourage the patient that this would be  beneficial for her and her husband. If the patient's symptoms worsen or she develops new symptoms she should let us know. Otherwise she will follow-up in 6 months with Dr. Jannifer Franklin or sooner if needed.   Ward Givens, MSN, NP-C 06/27/2014, 1:43 PM [facility] [address], [place], [place] [address] [phone]  Note: This document was prepared with digital dictation and possible smart phrase technology. Any transcriptional errors that result  from this process are unintentional.

## 2014-06-27 NOTE — Patient Instructions (Signed)
Continue Keppra If you have any seizure events let us know.

## 2014-06-27 NOTE — Progress Notes (Signed)
I have read the note, and I agree with the clinical assessment and plan.  Abad Manard KEITH   

## 2014-06-29 ENCOUNTER — Ambulatory Visit (INDEPENDENT_AMBULATORY_CARE_PROVIDER_SITE_OTHER): Payer: Medicare Other | Admitting: Podiatry

## 2014-06-29 DIAGNOSIS — M79676 Pain in unspecified toe(s): Secondary | ICD-10-CM | POA: Diagnosis not present

## 2014-06-29 DIAGNOSIS — B351 Tinea unguium: Secondary | ICD-10-CM

## 2014-06-29 DIAGNOSIS — L84 Corns and callosities: Secondary | ICD-10-CM

## 2014-06-30 NOTE — Progress Notes (Signed)
Subjective:     Patient ID: Shelley Navarro, female   DOB: 05-19-1920, 81 y.o.   MRN: [id number]  HPI 81 year old female presents the office today with complaints of painful thick discolored toenails. She denies any recent redness or drainage from the nail sites. She denies any injury to bilateral lower chemise. She is able to trim the nails herself. No other complaints at this time. She presents today with a caregiver.  Review of Systems  All other systems reviewed and are negative.      Objective:   Physical Exam AAO 3, NAD DP/PT pulses palpable, CRT less than 3 seconds Protective sensation intact with Simms Weinstein monofilament, vibratory sensation intact, Achilles tendon reflex intact Nails are hypertrophic, dystrophic, elongated, brittle, discolored 10. There is subjective tenderness on nails 1 through 5 bilaterally. No swelling erythema or drainage. There is hyperkeratotic lesions of the distal aspect of the right second and third digit with underlying hammertoe contractures. Upon debridement lesion there is no underlying ulceration, drainage or other clinical signs of infection. No open lesions identified bilaterally. No other areas of tenderness to bilateral lower extremities. No overlying edema, erythema, increase in warmth. No pain with calf compression, swelling, warmth, erythema.    Assessment:     81 year old female with symptomatic onychomycosis, hyperkeratotic lesions 2    Plan:     -Treatment options discussed with the patient including alternatives, risks, complications. -Nail sharply debrided 10 without complications/bleeding. -Hyperkeratotic lesion sharply debrided 2 without complications/bleeding. -Discussed daily foot inspection. -Follow-up in 3 months or sooner if any problems are to arise. In the meantime, encouraged to call the office with any questions, concerns, change in symptoms.

## 2014-07-18 ENCOUNTER — Telehealth: Payer: Self-pay

## 2014-07-18 NOTE — Telephone Encounter (Signed)
LVM for patient to call the practice for confirmation of flu shot or can call the office for an apt to receive a flu vaccine before 08/03/2013.

## 2014-09-28 ENCOUNTER — Ambulatory Visit (INDEPENDENT_AMBULATORY_CARE_PROVIDER_SITE_OTHER): Payer: Medicare Other | Admitting: Podiatry

## 2014-09-28 VITALS — BP 128/73 | HR 67 | Temp 97.4°F | Resp 16

## 2014-09-28 DIAGNOSIS — B351 Tinea unguium: Secondary | ICD-10-CM

## 2014-09-28 DIAGNOSIS — M79676 Pain in unspecified toe(s): Secondary | ICD-10-CM | POA: Diagnosis not present

## 2014-09-28 NOTE — Progress Notes (Signed)
Patient ID: Shelley Navarro, female   DOB: [birth year], 81 y.o.   MRN: [id number]  Subjective: 81 y.o.-year-old female returns the office today for painful, elongated, thickened toenails and corsn to the ends of her 2nd and 3rd toes.. Denies any redness or drainage around the nails/calluses. Denies any acute changes since last appointment and no new complaints today. Denies any systemic complaints such as fevers, chills, nausea, vomiting.   Objective: AAO 3, NAD DP/PT pulses palpable, CRT less than 3 seconds Protective sensation intact with Simms Weinstein monofilament, Achilles tendon reflex intact.  Nails hypertrophic, dystrophic, elongated, brittle, discolored 10. There is tenderness overlying the nails 1-5 bilaterally. There is no surrounding erythema or drainage along the nail sites. Hyperkerotic lesion distal 2nd and 3rd digit bilaterally. Upon debridement, no underlying ulceration, drainage, or clinical signs of infection.  Hammertoe contractures bilaterally.  No open lesions or other pre-ulcerative lesions are identified. No other areas of tenderness bilateral lower extremities. No overlying edema, erythema, increased warmth. No pain with calf compression, swelling, warmth, erythema.  Assessment: Patient presents with symptomatic onychomycosis  Plan: -Treatment options including alternatives, risks, complications were discussed -Nails sharply debrided 10 without complication/bleeding. -Hyperkerotic lesions sharply debrided x 4 without complications/bleeding.  -Discussed daily foot inspection. If there are any changes, to call the office immediately.  -Follow-up in 3 months or sooner if any problems are to arise. In the meantime, encouraged to call the office with any questions, concerns, changes symptoms.

## 2014-12-04 ENCOUNTER — Emergency Department (HOSPITAL_COMMUNITY): Payer: Medicare Other

## 2014-12-04 ENCOUNTER — Encounter (HOSPITAL_COMMUNITY): Payer: Self-pay | Admitting: Emergency Medicine

## 2014-12-04 ENCOUNTER — Emergency Department (HOSPITAL_COMMUNITY)
Admission: EM | Admit: 2014-12-04 | Discharge: 2014-12-04 | Disposition: A | Discharge status: Skilled Nursing Facility | Payer: Medicare Other | Attending: Emergency Medicine | Admitting: Emergency Medicine

## 2014-12-04 DIAGNOSIS — Z862 Personal history of diseases of the blood and blood-forming organs and certain disorders involving the immune mechanism: Secondary | ICD-10-CM | POA: Diagnosis not present

## 2014-12-04 DIAGNOSIS — T148XXA Other injury of unspecified body region, initial encounter: Secondary | ICD-10-CM

## 2014-12-04 DIAGNOSIS — M810 Age-related osteoporosis without current pathological fracture: Secondary | ICD-10-CM | POA: Diagnosis not present

## 2014-12-04 DIAGNOSIS — R609 Edema, unspecified: Secondary | ICD-10-CM

## 2014-12-04 DIAGNOSIS — I10 Essential (primary) hypertension: Secondary | ICD-10-CM | POA: Diagnosis not present

## 2014-12-04 DIAGNOSIS — Z8719 Personal history of other diseases of the digestive system: Secondary | ICD-10-CM | POA: Diagnosis not present

## 2014-12-04 DIAGNOSIS — Z8669 Personal history of other diseases of the nervous system and sense organs: Secondary | ICD-10-CM | POA: Diagnosis not present

## 2014-12-04 DIAGNOSIS — Z79899 Other long term (current) drug therapy: Secondary | ICD-10-CM | POA: Diagnosis not present

## 2014-12-04 DIAGNOSIS — Z8709 Personal history of other diseases of the respiratory system: Secondary | ICD-10-CM | POA: Insufficient documentation

## 2014-12-04 DIAGNOSIS — Y92129 Unspecified place in nursing home as the place of occurrence of the external cause: Secondary | ICD-10-CM | POA: Diagnosis not present

## 2014-12-04 DIAGNOSIS — Y999 Unspecified external cause status: Secondary | ICD-10-CM | POA: Diagnosis not present

## 2014-12-04 DIAGNOSIS — E119 Type 2 diabetes mellitus without complications: Secondary | ICD-10-CM | POA: Diagnosis not present

## 2014-12-04 DIAGNOSIS — Z86018 Personal history of other benign neoplasm: Secondary | ICD-10-CM | POA: Insufficient documentation

## 2014-12-04 DIAGNOSIS — Y939 Activity, unspecified: Secondary | ICD-10-CM | POA: Insufficient documentation

## 2014-12-04 DIAGNOSIS — S8991XA Unspecified injury of right lower leg, initial encounter: Secondary | ICD-10-CM | POA: Diagnosis present

## 2014-12-04 DIAGNOSIS — S82401A Unspecified fracture of shaft of right fibula, initial encounter for closed fracture: Secondary | ICD-10-CM | POA: Diagnosis not present

## 2014-12-04 DIAGNOSIS — X58XXXA Exposure to other specified factors, initial encounter: Secondary | ICD-10-CM | POA: Insufficient documentation

## 2014-12-04 MED ORDER — HYDROCODONE-ACETAMINOPHEN 5-325 MG PO TABS: 1.0000 | ORAL_TABLET | Freq: Four times a day (QID) | ORAL | Status: DC | PRN

## 2014-12-04 NOTE — ED Notes (Signed)
Brought in by EMS from [facility] facility with c/o right fibula fracture.  Per EMS, staff at the facility reported that pt c/o right leg pain yesterday morning; pt's right leg was observed to be swollen and discolored--- cause of pt's symptoms unknown: no reported falls or trauma.  Portable x-ray to RLE was done at the facility---- result came in tonight and showed ""acute distal fibula fracture".  EMS was called for ED transfer for further evaluation and treatment.  Pt was given a total of 150 mcg of Fentanyl en route to ED.  Pt arrived to ED alert, on KED and leg splint.

## 2014-12-04 NOTE — ED Notes (Signed)
Pt. Oxygen level decreased 90%. RN, Cheral Bay made aware and pt .placed on 2 liters oxygen.

## 2014-12-04 NOTE — ED Provider Notes (Addendum)
CSN: [id number]     Arrival date & time 12/04/14  0023 History   First MD Initiated Contact with Patient 12/04/14 0029     Chief Complaint  Patient presents with  . Leg Injury   Level V caveat due to age  (Consider location/radiation/quality/duration/timing/severity/associated sxs/prior Treatment) HPI  Per EMS patient is a resident at a nursing home. They report the facility where she lives reported she was noted to have swelling and bruising of her right lower leg. She was found in bed and there was no known fall. They did a portable x-ray which showed a fracture. Patient had been given 150 g of fentanyl prior to coming to the ED. Patient cannot tell me where she hurts.   PCP Dr [name]  Past Medical History  Diagnosis Date  . Anemia   . Glaucoma   . Diverticulitis 1995  . Osteoporosis     s/p wrist and shoulder fractures  . Right bundle branch block   . Granulomatous lung disease     on xray  . Personal history of unspecified disease   . Personal history of other diseases of digestive system   . Type II or unspecified type diabetes mellitus without mention of complication, not stated as uncontrolled   . Osteoarthrosis, unspecified whether generalized or localized, unspecified site   . Unspecified essential hypertension   . External hemorrhoids without mention of complication   . Benign neoplasm of colon     Dr Carlean Purl  . Barrett's esophagus   . Biliary acute pancreatitis 2005  . Hyperlipidemia     NMR lipoprofile 2008: LDL 131 (1260/397), HDL 68 TG 82  . PMR (polymyalgia rheumatica) 02/2011-06/2011  . Gait disturbance   . Other malaise and fatigue 04/27/2013  . PONV (postoperative nausea and vomiting)    Past Surgical History  Procedure Laterality Date  . Appendectomy    . Total abdominal hysterectomy w/ bilateral salpingoophorectomy    . Dilation and curettage of uterus    . Cholecystectomy  2003    Dr.Rosenbower  . Scrofula-ectomy    . Colonoscopy w/ polypectomy     . Tonsillectomy    . Orif shoulder fracture  1980  . Back surgery    . Cystectomy      Left Neck  . Wrist fracture surgery    . Esophagogastroduodenoscopy      Multiple EGD's Dr.Gessner  . Esi  06/07/11     [facility]  . Cystoscopy      ? 2000; Dr Serita Butcher   Family History  Problem Relation Age of Onset  . Heart attack Father 41  . Diabetes Mother   . Osteoporosis Mother   . Hip fracture Mother   . Emphysema Mother     Non-smoker  . Heart attack Brother 52  . Cancer Brother     bone  . Emphysema Brother   . Lung cancer Sister   . Cancer Sister     lung  . Bone cancer Other     Aunt-? M or P-Aunt  . Cancer Other     bone  . COPD Brother    History  Substance Use Topics  . Smoking status: Never Smoker   . Smokeless tobacco: Never Used  . Alcohol Use: No   OB History    No data available     Review of Systems  Unable to perform ROS: Age      Allergies  Sulfonamide derivatives and Ciprofloxacin  Home Medications   Prior  to Admission medications   Medication Sig Start Date End Date Taking? Authorizing Provider  acetaminophen (TYLENOL) 500 MG tablet Take 500 mg by mouth every 6 (six) hours as needed for moderate pain.    Historical Provider, MD  benazepril (LOTENSIN) 20 MG tablet TAKE 1 TABLET BY MOUTH EVERY DAY 12/07/13   Hendricks Limes, MD  calcium-vitamin D (OSCAL WITH D) 500-200 MG-UNIT per tablet Take 1 tablet by mouth daily.      Historical Provider, MD  Cholecalciferol (VITAMIN D3) 1000 UNITS CAPS Take 1,000 Units by mouth daily.     Historical Provider, MD  ibandronate (BONIVA) 150 MG tablet Take 150 mg by mouth every 30 (thirty) days. Take in the morning with a full glass of water, on an empty stomach, and do not take anything else by mouth or lie down for the next 30 min.    Historical Provider, MD  levETIRAcetam (KEPPRA) 500 MG tablet TAKE 1 TABLET BY MOUTH TWICE A DAY 05/23/14   Kathrynn Ducking, MD  Multiple Vitamin (MULTIVITAMIN) tablet Take 1  tablet by mouth daily.      Historical Provider, MD  omeprazole (PRILOSEC) 20 MG capsule Take 20 mg by mouth daily.      Historical Provider, MD  phenazopyridine (PYRIDIUM) 200 MG tablet Take 200 mg by mouth daily. 11/12/13   Hendricks Limes, MD  tiZANidine (ZANAFLEX) 2 MG tablet TAKE 1 TABLET BY MOUTH TWICE A DAY 12/27/13   Kathrynn Ducking, MD  vitamin E 400 UNIT capsule Take 400 Units by mouth daily.      Historical Provider, MD   BP 174/62 mmHg  Pulse 66  Resp 16  SpO2 90%  Vital signs normal except for borderline pulse ox (patient had received pain medicine already from EMS)  Physical Exam  Constitutional:  Non-toxic appearance. She does not appear ill. No distress.  Frail elderly female  HENT:  Head: Normocephalic and atraumatic.  Right Ear: External ear normal.  Left Ear: External ear normal.  Nose: Nose normal. No mucosal edema or rhinorrhea.  Mouth/Throat: Oropharynx is clear and moist and mucous membranes are normal. No dental abscesses or uvula swelling.  Dry tongue  Eyes: Conjunctivae and EOM are normal. Pupils are equal, round, and reactive to light.  Neck: Normal range of motion and full passive range of motion without pain. Neck supple.  Cardiovascular: Normal rate, regular rhythm and normal heart sounds.  Exam reveals no gallop and no friction rub.   No murmur heard. Pulmonary/Chest: Effort normal and breath sounds normal. No respiratory distress. She has no wheezes. She has no rhonchi. She has no rales. She exhibits no tenderness and no crepitus.  Abdominal: Soft. Normal appearance and bowel sounds are normal. She exhibits no distension. There is no tenderness. There is no rebound and no guarding.  Musculoskeletal: Normal range of motion. She exhibits edema and tenderness.  Moves upper extremities well.   Patient's noted to have some bruising that is yellowish  on the lateral aspect of her left lower leg  Patient's noted to have diffuse purplish bruising on the  lateral aspect of her right lower leg with edema.    Neurological: She has normal strength. No cranial nerve deficit.  Sleeping easily arousable, nose her birthday is on Halloween but cannot tell me the year, cannot tell me what year it is now, cannot tell me where she is now, can tell me her first thing is Benjamine Mola  Skin: Skin is warm, dry and intact.  No rash noted. No erythema. No pallor.  Psychiatric: She has a normal mood and affect. Her speech is normal and behavior is normal. Her mood appears not anxious.  Nursing note and vitals reviewed.        ED Course  Procedures (including critical care time)  Patient's facility states patient does get up to participate in activities. She was placed in a cam walker. This can be removed for bathing and will be easier to manage than a splint.  Patient was noted to become hypoxic which was felt to be from the pain medication she was given by EMS in route to the ED.  Around 2 AM her oxygen was discontinued. Her pulse ox was around 90-91%.  Around 4 AM her pulse ox had improved to 93-94% on room air. At this point I felt comfortable discharging her back to her facility.  Social work consult was placed b/o fracture with no known injury.   Labs Review Labs Reviewed - No data to display  Imaging Review Dg Tibia/fibula Left Port  12/04/2014   CLINICAL DATA:  Lower extremity pain.  No known trauma.  EXAM: PORTABLE LEFT TIBIA AND FIBULA - 2 VIEW  COMPARISON:  None.  FINDINGS: Negative for acute fracture or dislocation. There is no radiopaque foreign body. There is no bone lesion or bony destruction.  IMPRESSION: Negative   Electronically Signed   By: Andreas Newport M.D.   On: 12/04/2014 01:22   Dg Tibia/fibula Right Port  12/04/2014   CLINICAL DATA:  Right leg pain.  No known trauma.  EXAM: PORTABLE RIGHT TIBIA AND FIBULA - 2 VIEW  COMPARISON:  None.  FINDINGS: There is a distal fibular fracture. No other fractures are evident. Incidentally  noted chondrocalcinosis of the knee joint.  IMPRESSION: Acute fracture of the distal fibula   Electronically Signed   By: Andreas Newport M.D.   On: 12/04/2014 01:22     EKG Interpretation None      MDM   Final diagnoses:  Swelling  Bruising  Fibula fracture, right, closed, initial encounter    New Prescriptions   HYDROCODONE-ACETAMINOPHEN (NORCO/VICODIN) 5-325 MG PER TABLET    Take 1 tablet by mouth every 6 (six) hours as needed for moderate pain or severe pain.    Plan discharge  Rolland Porter, MD, Barbette Or, MD 12/04/14 [place], MD 12/04/14 [phone]

## 2014-12-04 NOTE — Discharge Instructions (Signed)
She has a distal fibular fracture. Have her wear the Cam walker when you have her out of bed. It can be removed for bathing. Call Dr Debroah Loop office to get a follow up appointment in the next 1-2 weeks. She can have the hydrocodone/acetaminophen for pain as needed.    Fibular Fracture, Ankle, Adult, Treated With or Without Immobilization A fibular fracture at your ankle is a break (fracture) bone in the smallest of the two bones in your lower leg, located on the outside of your leg (fibula) close to the area at your ankle joint. CAUSES  Rolling your ankle.  Twisting your ankle.  Extreme flexing or extending of your foot.  Severe force on your ankle as when falling from a distance. RISK FACTORS  Jumping activities.  Participation in sports.  Osteoporosis.  Advanced age.  Previous ankle injuries. SIGNS AND SYMPTOMS  Pain.  Swelling.  Inability to put weight on injured ankle.  Bruising.  Bone deformities at site of injury. DIAGNOSIS  This fracture is diagnosed with the help of an X-ray exam. TREATMENT  If the fractured bone did not move out of place it usually will heal without problems and does casting or splinting. If immobilization is needed for comfort or the fractured bone moved out of place and will not heal properly with immobilization, a cast or splint will be used. HOME CARE INSTRUCTIONS   Apply ice to the area of injury:  Put ice in a plastic bag.  Place a towel between your skin and the bag.  Leave the ice on for 20 minutes, 2-3 times a day.  Use crutches as directed. Resume walking without crutches as directed by your health care provider.  Only take over-the-counter or prescription medicines for pain, discomfort, or fever as directed by your health care provider.  If you have a removable splint or boot, do not remove the boot unless directed by your health care provider. SEEK MEDICAL CARE IF:   You have continued pain or more swelling  The  medications do not control the pain. SEEK IMMEDIATE MEDICAL CARE IF:  You develop severe pain in the leg or foot.  Your skin or nails below the injury turn blue or grey or feel cold or numb. MAKE SURE YOU:   Understand these instructions.  Will watch your condition.  Will get help right away if you are not doing well or get worse. Document Released: 04/22/2005 Document Revised: 02/10/2013 Document Reviewed: 12/02/2012 [facility] Patient Information 2015 [place], [place]. This information is not intended to replace advice given to you by your health care provider. Make sure you discuss any questions you have with your health care provider.

## 2014-12-04 NOTE — ED Notes (Signed)
Staff at [facility] was called---- report on pt's condition and discharge instructions provided.

## 2014-12-04 NOTE — ED Notes (Signed)
[facility] was called for pt's transportation back to [facility] facility.

## 2014-12-04 NOTE — ED Notes (Signed)
Per [facility], pt still actively participates with activities of daily living---- pt gets out of bed to wheelchair daily.

## 2014-12-04 NOTE — ED Notes (Signed)
Bed: ZC02 Expected date:  Expected time:  Means of arrival:  Comments: EMS 98F fibula fx

## 2014-12-04 NOTE — ED Notes (Signed)
[facility] here to transport pt back to [facility]. 

## 2014-12-26 ENCOUNTER — Telehealth: Payer: Self-pay

## 2014-12-26 ENCOUNTER — Ambulatory Visit: Payer: Medicare Other | Admitting: Adult Health

## 2014-12-26 NOTE — Telephone Encounter (Addendum)
Patient no- showed her apt.

## 2015-01-04 ENCOUNTER — Encounter: Payer: Self-pay | Admitting: Podiatry

## 2015-01-04 ENCOUNTER — Ambulatory Visit (INDEPENDENT_AMBULATORY_CARE_PROVIDER_SITE_OTHER): Payer: Medicare Other | Admitting: Podiatry

## 2015-01-04 DIAGNOSIS — M79676 Pain in unspecified toe(s): Secondary | ICD-10-CM

## 2015-01-04 DIAGNOSIS — B351 Tinea unguium: Secondary | ICD-10-CM | POA: Diagnosis not present

## 2015-01-04 NOTE — Patient Instructions (Signed)
Today I debrided mycotic or fungal toenails 10 while patient was seated in a wheelchair. Return intervals recommended in 4 months

## 2015-01-04 NOTE — Progress Notes (Signed)
Patient ID: Shelley Navarro, female   DOB: 1920/06/16, 81 y.o.   MRN: [id number]  Subjective: Patient presents today for scheduled visit complaining of painful toenails and requests toenail debridement  Objective: Patient is seated in a wheelchair and unable to transfer Cam Walker boot worn on 1 extremity The toenails are elongated, brittle, incurvated, discolored and tender direct palpation 6-10  Assessment: Symptomatic onychomycoses 6-10  Plan: Patient was seated in a wheelchair Debridement toenails 10 and chemically and electrically without any bleeding  Reappoint 4 months

## 2015-01-20 ENCOUNTER — Encounter: Payer: Self-pay | Admitting: Internal Medicine

## 2015-01-23 ENCOUNTER — Emergency Department (HOSPITAL_COMMUNITY): Payer: Medicare Other

## 2015-01-23 ENCOUNTER — Encounter (HOSPITAL_COMMUNITY): Payer: Self-pay

## 2015-01-23 ENCOUNTER — Inpatient Hospital Stay (HOSPITAL_COMMUNITY)
Admission: EM | Admit: 2015-01-23 | Discharge: 2015-01-25 | DRG: 689 | Disposition: A | Discharge status: Skilled Nursing Facility | Payer: Medicare Other | Attending: Internal Medicine | Admitting: Internal Medicine

## 2015-01-23 DIAGNOSIS — R404 Transient alteration of awareness: Secondary | ICD-10-CM | POA: Diagnosis not present

## 2015-01-23 DIAGNOSIS — G9340 Encephalopathy, unspecified: Secondary | ICD-10-CM | POA: Diagnosis present

## 2015-01-23 DIAGNOSIS — Z8249 Family history of ischemic heart disease and other diseases of the circulatory system: Secondary | ICD-10-CM

## 2015-01-23 DIAGNOSIS — N390 Urinary tract infection, site not specified: Principal | ICD-10-CM | POA: Diagnosis present

## 2015-01-23 DIAGNOSIS — Z808 Family history of malignant neoplasm of other organs or systems: Secondary | ICD-10-CM

## 2015-01-23 DIAGNOSIS — Z993 Dependence on wheelchair: Secondary | ICD-10-CM

## 2015-01-23 DIAGNOSIS — B9689 Other specified bacterial agents as the cause of diseases classified elsewhere: Secondary | ICD-10-CM | POA: Diagnosis present

## 2015-01-23 DIAGNOSIS — E119 Type 2 diabetes mellitus without complications: Secondary | ICD-10-CM | POA: Diagnosis not present

## 2015-01-23 DIAGNOSIS — Z833 Family history of diabetes mellitus: Secondary | ICD-10-CM

## 2015-01-23 DIAGNOSIS — Z825 Family history of asthma and other chronic lower respiratory diseases: Secondary | ICD-10-CM

## 2015-01-23 DIAGNOSIS — E1165 Type 2 diabetes mellitus with hyperglycemia: Secondary | ICD-10-CM | POA: Diagnosis present

## 2015-01-23 DIAGNOSIS — Z90710 Acquired absence of both cervix and uterus: Secondary | ICD-10-CM

## 2015-01-23 DIAGNOSIS — F03A0 Unspecified dementia, mild, without behavioral disturbance, psychotic disturbance, mood disturbance, and anxiety: Secondary | ICD-10-CM | POA: Diagnosis present

## 2015-01-23 DIAGNOSIS — M353 Polymyalgia rheumatica: Secondary | ICD-10-CM | POA: Diagnosis present

## 2015-01-23 DIAGNOSIS — G40409 Other generalized epilepsy and epileptic syndromes, not intractable, without status epilepticus: Secondary | ICD-10-CM | POA: Diagnosis present

## 2015-01-23 DIAGNOSIS — R1310 Dysphagia, unspecified: Secondary | ICD-10-CM | POA: Diagnosis present

## 2015-01-23 DIAGNOSIS — H409 Unspecified glaucoma: Secondary | ICD-10-CM | POA: Diagnosis present

## 2015-01-23 DIAGNOSIS — M810 Age-related osteoporosis without current pathological fracture: Secondary | ICD-10-CM | POA: Diagnosis present

## 2015-01-23 DIAGNOSIS — R4182 Altered mental status, unspecified: Secondary | ICD-10-CM

## 2015-01-23 DIAGNOSIS — Z801 Family history of malignant neoplasm of trachea, bronchus and lung: Secondary | ICD-10-CM

## 2015-01-23 DIAGNOSIS — N3090 Cystitis, unspecified without hematuria: Secondary | ICD-10-CM

## 2015-01-23 DIAGNOSIS — Z66 Do not resuscitate: Secondary | ICD-10-CM | POA: Diagnosis present

## 2015-01-23 DIAGNOSIS — B961 Klebsiella pneumoniae [K. pneumoniae] as the cause of diseases classified elsewhere: Secondary | ICD-10-CM | POA: Diagnosis present

## 2015-01-23 DIAGNOSIS — I10 Essential (primary) hypertension: Secondary | ICD-10-CM | POA: Diagnosis present

## 2015-01-23 DIAGNOSIS — E785 Hyperlipidemia, unspecified: Secondary | ICD-10-CM | POA: Diagnosis present

## 2015-01-23 DIAGNOSIS — F0390 Unspecified dementia without behavioral disturbance: Secondary | ICD-10-CM | POA: Diagnosis present

## 2015-01-23 DIAGNOSIS — M1990 Unspecified osteoarthritis, unspecified site: Secondary | ICD-10-CM | POA: Diagnosis present

## 2015-01-23 HISTORY — DX: Unspecified convulsions: R56.9

## 2015-01-23 HISTORY — DX: Gastro-esophageal reflux disease without esophagitis: K21.9

## 2015-01-23 HISTORY — DX: Urinary tract infection, site not specified: N39.0

## 2015-01-23 LAB — URINE MICROSCOPIC-ADD ON

## 2015-01-23 LAB — I-STAT CG4 LACTIC ACID, ED
LACTIC ACID, VENOUS: 0.74 mmol/L (ref 0.5–2.0)
Lactic Acid, Venous: 1.28 mmol/L (ref 0.5–2.0)

## 2015-01-23 LAB — CBC WITH DIFFERENTIAL/PLATELET
Basophils Absolute: 0 10*3/uL (ref 0.0–0.1)
Basophils Relative: 0 %
Eosinophils Absolute: 0.4 10*3/uL (ref 0.0–0.7)
Eosinophils Relative: 5 %
HCT: 35.7 % — ABNORMAL LOW (ref 36.0–46.0)
Hemoglobin: 11 g/dL — ABNORMAL LOW (ref 12.0–15.0)
LYMPHS PCT: 19 %
Lymphs Abs: 1.6 10*3/uL (ref 0.7–4.0)
MCH: 26.3 pg (ref 26.0–34.0)
MCHC: 30.8 g/dL (ref 30.0–36.0)
MCV: 85.2 fL (ref 78.0–100.0)
Monocytes Absolute: 0.9 10*3/uL (ref 0.1–1.0)
Monocytes Relative: 10 %
Neutro Abs: 5.3 10*3/uL (ref 1.7–7.7)
Neutrophils Relative %: 66 %
PLATELETS: 332 10*3/uL (ref 150–400)
RBC: 4.19 MIL/uL (ref 3.87–5.11)
RDW: 15.6 % — ABNORMAL HIGH (ref 11.5–15.5)
WBC: 8.2 10*3/uL (ref 4.0–10.5)

## 2015-01-23 LAB — URINALYSIS, ROUTINE W REFLEX MICROSCOPIC
Bilirubin Urine: NEGATIVE
Glucose, UA: NEGATIVE mg/dL
Hgb urine dipstick: NEGATIVE
KETONES UR: NEGATIVE mg/dL
NITRITE: NEGATIVE
PROTEIN: NEGATIVE mg/dL
Specific Gravity, Urine: 1.018 (ref 1.005–1.030)
Urobilinogen, UA: 1 mg/dL (ref 0.0–1.0)
pH: 6 (ref 5.0–8.0)

## 2015-01-23 LAB — COMPREHENSIVE METABOLIC PANEL
ALT: 11 U/L — ABNORMAL LOW (ref 14–54)
ANION GAP: 8 (ref 5–15)
AST: 17 U/L (ref 15–41)
Albumin: 3 g/dL — ABNORMAL LOW (ref 3.5–5.0)
Alkaline Phosphatase: 59 U/L (ref 38–126)
BUN: 22 mg/dL — ABNORMAL HIGH (ref 6–20)
CHLORIDE: 106 mmol/L (ref 101–111)
CO2: 29 mmol/L (ref 22–32)
Calcium: 10.1 mg/dL (ref 8.9–10.3)
Creatinine, Ser: 0.86 mg/dL (ref 0.44–1.00)
GFR, EST NON AFRICAN AMERICAN: 57 mL/min — AB (ref >60)
Glucose, Bld: 122 mg/dL — ABNORMAL HIGH (ref 65–99)
POTASSIUM: 3.6 mmol/L (ref 3.5–5.1)
Sodium: 143 mmol/L (ref 135–145)
Total Bilirubin: 0.5 mg/dL (ref 0.3–1.2)
Total Protein: 5.8 g/dL — ABNORMAL LOW (ref 6.5–8.1)

## 2015-01-23 LAB — VITAMIN B12: VITAMIN B 12: 496 pg/mL (ref 180–914)

## 2015-01-23 LAB — TROPONIN I: Troponin I: <0.03 ng/mL (ref <0.031)

## 2015-01-23 MED ORDER — DEXTROSE 5 % IV SOLN
1.0000 g | INTRAVENOUS | Status: DC
  Administered 2015-01-24: 1 g via INTRAVENOUS
  Filled 2015-01-23 (×2): qty 10

## 2015-01-23 MED ORDER — ACETAMINOPHEN 325 MG PO TABS: 650.0000 mg | ORAL_TABLET | Freq: Four times a day (QID) | ORAL | Status: DC | PRN

## 2015-01-23 MED ORDER — DEXTROSE 5 % IV SOLN
1.0000 g | Freq: Once | INTRAVENOUS | Status: AC
  Administered 2015-01-23: 1 g via INTRAVENOUS
  Filled 2015-01-23: qty 10

## 2015-01-23 MED ORDER — BENAZEPRIL HCL 20 MG PO TABS
20.0000 mg | ORAL_TABLET | Freq: Every day | ORAL | Status: DC
  Administered 2015-01-24 – 2015-01-25 (×2): 20 mg via ORAL
  Filled 2015-01-23 (×2): qty 1

## 2015-01-23 MED ORDER — LEVETIRACETAM 500 MG PO TABS
500.0000 mg | ORAL_TABLET | Freq: Two times a day (BID) | ORAL | Status: DC
  Administered 2015-01-23 – 2015-01-25 (×3): 500 mg via ORAL
  Filled 2015-01-23 (×4): qty 1

## 2015-01-23 MED ORDER — ENOXAPARIN SODIUM 30 MG/0.3ML ~~LOC~~ SOLN
30.0000 mg | SUBCUTANEOUS | Status: DC
  Administered 2015-01-23: 30 mg via SUBCUTANEOUS
  Filled 2015-01-23 (×2): qty 0.3

## 2015-01-23 MED ORDER — SODIUM CHLORIDE 0.9 % IJ SOLN: 3.0000 mL | Freq: Two times a day (BID) | INTRAMUSCULAR | Status: DC

## 2015-01-23 MED ORDER — FAMOTIDINE 20 MG PO TABS
20.0000 mg | ORAL_TABLET | Freq: Every day | ORAL | Status: DC
  Administered 2015-01-24 – 2015-01-25 (×2): 20 mg via ORAL
  Filled 2015-01-23 (×2): qty 1

## 2015-01-23 MED ORDER — ACETAMINOPHEN 650 MG RE SUPP: 650.0000 mg | Freq: Four times a day (QID) | RECTAL | Status: DC | PRN

## 2015-01-23 MED ORDER — SODIUM CHLORIDE 0.9 % IV SOLN
INTRAVENOUS | Status: DC
  Administered 2015-01-23 – 2015-01-24 (×2): via INTRAVENOUS

## 2015-01-23 MED ORDER — SERTRALINE HCL 50 MG PO TABS
75.0000 mg | ORAL_TABLET | Freq: Every day | ORAL | Status: DC
  Administered 2015-01-24 – 2015-01-25 (×2): 75 mg via ORAL
  Filled 2015-01-23 (×4): qty 1

## 2015-01-23 NOTE — ED Notes (Signed)
Report attempted 

## 2015-01-23 NOTE — ED Notes (Signed)
Pt is intermittently able to respond to questions, and verbalize answers.  Pt intermittently is able to follow to follow commands.

## 2015-01-23 NOTE — ED Notes (Signed)
Performed swallow study.  Pt able to swallow liquid from cup.  Pt successfully swallowed liquid from straw.  Pt coughed when eating cracker.  Lung sounds remain unchanged.

## 2015-01-23 NOTE — ED Notes (Signed)
Asked pt if she knew her name.  Pt repeatedly stated Shelley Navarro.  Asked pt if she knew her last name.  Pt repeated "Shelley Navarro",  closed eyes and fell back asleep. Pt has tremors in both arms/hands.

## 2015-01-23 NOTE — ED Provider Notes (Signed)
CSN: [id number]     Arrival date & time 01/23/15  1327 History   First MD Initiated Contact with Patient 01/23/15 1340     Chief Complaint  Patient presents with  . Altered Mental Status     (Consider location/radiation/quality/duration/timing/severity/associated sxs/prior Treatment) HPI Comments: 81 y.o. Female with history of DM, HTN, Hyperlipidemia presents from her nursing facility for change in mental status.  Per initial report the patient was found to be less interactive than usual with a possible facial droop.  The initial report said that this started about 2 hours prior to presentation but family came bedside and said that the symptoms have been progressive over the last few days with the patient just not seeming like herself and making less and less sense.  No known fever or chills.  No known nausea or vomiting.  No known trauma.  Patient is a 81 y.o. female presenting with altered mental status.  Altered Mental Status   Past Medical History  Diagnosis Date  . Anemia   . Glaucoma   . Diverticulitis 1995  . Osteoporosis     s/p wrist and shoulder fractures  . Right bundle branch block   . Granulomatous lung disease     on xray  . Personal history of unspecified disease   . Personal history of other diseases of digestive system   . Type II or unspecified type diabetes mellitus without mention of complication, not stated as uncontrolled   . Osteoarthrosis, unspecified whether generalized or localized, unspecified site   . Unspecified essential hypertension   . External hemorrhoids without mention of complication   . Benign neoplasm of colon     Dr Carlean Purl  . Barrett's esophagus   . Biliary acute pancreatitis 2005  . Hyperlipidemia     NMR lipoprofile 2008: LDL 131 (1260/397), HDL 68 TG 82  . PMR (polymyalgia rheumatica) 02/2011-06/2011  . Gait disturbance   . Other malaise and fatigue 04/27/2013  . PONV (postoperative nausea and vomiting)    Past Surgical History   Procedure Laterality Date  . Appendectomy    . Total abdominal hysterectomy w/ bilateral salpingoophorectomy    . Dilation and curettage of uterus    . Cholecystectomy  2003    Dr.Rosenbower  . Scrofula-ectomy    . Colonoscopy w/ polypectomy    . Tonsillectomy    . Orif shoulder fracture  1980  . Back surgery    . Cystectomy      Left Neck  . Wrist fracture surgery    . Esophagogastroduodenoscopy      Multiple EGD's Dr.Gessner  . Esi  06/07/11     [facility]  . Cystoscopy      ? 2000; Dr Serita Butcher   Family History  Problem Relation Age of Onset  . Heart attack Father 2  . Diabetes Mother   . Osteoporosis Mother   . Hip fracture Mother   . Emphysema Mother     Non-smoker  . Heart attack Brother 62  . Cancer Brother     bone  . Emphysema Brother   . Lung cancer Sister   . Cancer Sister     lung  . Bone cancer Other     Aunt-? M or P-Aunt  . Cancer Other     bone  . COPD Brother    Social History  Substance Use Topics  . Smoking status: Never Smoker   . Smokeless tobacco: Never Used  . Alcohol Use: No   OB History  No data available     Review of Systems  Unable to perform ROS: Mental status change      Allergies  Sulfonamide derivatives and Ciprofloxacin  Home Medications   Prior to Admission medications   Medication Sig Start Date End Date Taking? Authorizing Provider  acetaminophen (TYLENOL) 500 MG tablet Take 500 mg by mouth every 8 (eight) hours as needed for moderate pain.    Yes Historical Provider, MD  benazepril (LOTENSIN) 20 MG tablet TAKE 1 TABLET BY MOUTH EVERY DAY 12/07/13  Yes Hendricks Limes, MD  calcium-vitamin D (OSCAL WITH D) 500-200 MG-UNIT per tablet Take 1 tablet by mouth daily.     Yes Historical Provider, MD  Cholecalciferol (VITAMIN D3) 1000 UNITS CAPS Take 1,000 Units by mouth daily.    Yes Historical Provider, MD  famotidine (PEPCID) 20 MG tablet Take 20 mg by mouth daily.   Yes Historical Provider, MD   HYDROcodone-acetaminophen (NORCO/VICODIN) 5-325 MG per tablet Take 1 tablet by mouth every 6 (six) hours as needed for moderate pain or severe pain. 12/04/14  Yes Rolland Porter, MD  ibandronate (BONIVA) 150 MG tablet Take 150 mg by mouth every 30 (thirty) days. Take in the morning with a full glass of water, on an empty stomach, and do not take anything else by mouth or lie down for the next 30 min.   Yes Historical Provider, MD  levETIRAcetam (KEPPRA) 500 MG tablet TAKE 1 TABLET BY MOUTH TWICE A DAY 05/23/14  Yes Kathrynn Ducking, MD  Melatonin-Pyridoxine (MELATIN PO) Take 5 mg by mouth at bedtime.   Yes Historical Provider, MD  Multiple Vitamin (MULTIVITAMIN) tablet Take 1 tablet by mouth daily.     Yes Historical Provider, MD  polyethylene glycol (MIRALAX / GLYCOLAX) packet Take 17 g by mouth daily.   Yes Historical Provider, MD  sertraline (ZOLOFT) 25 MG tablet Take 75 mg by mouth daily.   Yes Historical Provider, MD  tiZANidine (ZANAFLEX) 2 MG tablet TAKE 1 TABLET BY MOUTH TWICE A DAY Patient not taking: Reported on 01/23/2015 12/27/13   Kathrynn Ducking, MD   BP 159/70 mmHg  Pulse 63  Temp(Src) 98.7 F (37.1 C) (Rectal)  Resp 21  SpO2 95% Physical Exam  Constitutional: No distress.  HENT:  Head: Normocephalic and atraumatic.  Right Ear: External ear normal.  Left Ear: External ear normal.  Mouth/Throat: Oropharynx is clear and moist. No oropharyngeal exudate.  Eyes: EOM are normal. Pupils are equal, round, and reactive to light.  Neck: Normal range of motion. Neck supple.  Cardiovascular: Normal rate, regular rhythm and intact distal pulses.   Pulmonary/Chest: Effort normal. No respiratory distress. She has no wheezes. She has no rales.  Abdominal: Soft. She exhibits no distension. There is no tenderness.  Musculoskeletal: She exhibits no edema or tenderness.  Neurological:  Patient not answering questions on my examination.  Following simple commands.  Right arm grasp slightly weaker  when compared to left.  Patient's EOM appear intact.  Mild facial asymmetry on grimacing.  Skin: Skin is warm and dry. No rash noted. She is not diaphoretic.  Vitals reviewed.   ED Course  Procedures (including critical care time) Labs Review Labs Reviewed  CBC WITH DIFFERENTIAL/PLATELET - Abnormal; Notable for the following:    Hemoglobin 11.0 (*)    HCT 35.7 (*)    RDW 15.6 (*)    All other components within normal limits  COMPREHENSIVE METABOLIC PANEL - Abnormal; Notable for the following:  Glucose, Bld 122 (*)    BUN 22 (*)    Total Protein 5.8 (*)    Albumin 3.0 (*)    ALT 11 (*)    GFR calc non Af Amer 57 (*)    All other components within normal limits  URINE CULTURE  TROPONIN I  URINALYSIS, ROUTINE W REFLEX MICROSCOPIC (NOT AT [facility])  I-STAT CG4 LACTIC ACID, ED    Imaging Review Ct Head Wo Contrast  01/23/2015   CLINICAL DATA:  Change in mental status.  EXAM: CT HEAD WITHOUT CONTRAST  TECHNIQUE: Contiguous axial images were obtained from the base of the skull through the vertex without intravenous contrast.  COMPARISON:  11/18/2011  FINDINGS: Ventricles, cisterns and other CSF spaces are mildly prominent compatible with age related atrophic change. There is chronic ischemic microvascular disease present. There is no mass, mass effect, shift of midline structures or acute hemorrhage. There is no evidence to suggest acute infarction. Remaining bones and soft tissues are within normal.  IMPRESSION: No acute intracranial findings.  Age related atrophic change and chronic ischemic microvascular disease.   Electronically Signed   By: Marin Olp M.D.   On: 01/23/2015 14:37   Dg Chest Portable 1 View  01/23/2015   CLINICAL DATA:  81 year old who became acutely nonverbal at the nursing home earlier today. Acute onset of cough and chest congestion.  EXAM: PORTABLE CHEST - 1 VIEW  COMPARISON:  10/14/2013 and earlier.  FINDINGS: Cardiac silhouette moderately to markedly enlarged but  stable. Extensive mitral annular calcification. Thoracic aorta atherosclerotic, unchanged. Hilar and mediastinal contours otherwise unremarkable. Suboptimal inspiration accounts for crowding of the bronchovascular markings diffusely and for atelectasis in the left lower lobe and lingula. Densely calcified granuloma in the left lower lobe, unchanged. Mild pulmonary venous hypertension without overt edema.  IMPRESSION: 1. Suboptimal inspiration accounts for atelectasis in the left lower lobe and lingula. 2. Stable moderate to marked cardiomegaly. Mild pulmonary venous hypertension without overt edema.   Electronically Signed   By: Evangeline Dakin M.D.   On: 01/23/2015 14:46   I have personally reviewed and evaluated these images and lab results as part of my medical decision-making.   EKG Interpretation   Date/Time:  Monday January 23 2015 13:37:15 EDT Ventricular Rate:  70 PR Interval:  150 QRS Duration: 147 QT Interval:  446 QTC Calculation: 481 R Axis:   -94 Text Interpretation:  Sinus rhythm RBBB and LAFB Consider left ventricular  hypertrophy No significant change since last tracing Confirmed by NGUYEN,  EMILY ([id number]) on 01/23/2015 3:29:02 PM      MDM  Patient was seen and evaluated in stable condition.  Niece at bedside who stated patient usually sharp mentally and that this change has been progressive over the last 3 days.  UA concerning for infection and patient started on Rocephin.  Culture sent.  CT head unremarkable.  Patient's mental status was variable while in the [facility] but no overt seizure like activity was present - neurology paged but unsuccessfully as concern for possible intermittent seizure in light of patient's history.  Case was discussed with Dr. Eliseo Squires who agreed with patient admission and the patient was admitted under the care of Dr. Eliseo Squires under observation with telemetry ordered.  Family updated on results and plan of care. Final diagnoses:  None    1. UTI  2. Altered  mental status    Harvel Quale, MD 01/23/15 [phone]

## 2015-01-23 NOTE — ED Notes (Signed)
Dr. Alfonse Spruce at the bedside assessing the pt.

## 2015-01-23 NOTE — ED Notes (Signed)
MD at the bedside  

## 2015-01-23 NOTE — ED Notes (Addendum)
Pt. resides at [facility], and they informed the paramedics that she became non-verbal and does not follow any commands , approximately at 11:45  Pt. 's normal is talking and following commands.   Pt. 's skins is warm and dry.  She opens eyes to touch her name and pain.  She has a congested cough.  She also has an extensive hx of UTIS,

## 2015-01-23 NOTE — H&P (Addendum)
[facility] History and Physical  BESSYE STITH IDP:[id number] DOB: [birth year] DOA: 01/23/2015  Referring physician: er PCP: Unice Cobble, MD   Chief Complaint: AMS  HPI: BALEIGH RENNAKER is a 81 y.o. female with h/o DM, RBBB and anemia Who lives in [place].  Per niece she is wheelchair bound, but usually conversant, "telling great stories".  Today, she became non-verbal and did not follow commands this started about 11:45 per SNF but family reports a progressive decline since Thursday.  Per niece, patient is a DNR.  No fever reported.  In the ER, she improved and was more awake and conversant-- asking for chapstick and water.  U/A was suggestive of UTI.  ER was concerned for CVA vs seizures.  Patient is reporting pain with urination   Review of Systems:  Unable to do full ROS as patient was confused, she does not report pain in chest or SOB, but does say it hurts when she pees  Past Medical History  Diagnosis Date  . Anemia   . Glaucoma   . Diverticulitis 1995  . Osteoporosis     s/p wrist and shoulder fractures  . Right bundle branch block   . Granulomatous lung disease     on xray  . Personal history of unspecified disease   . Personal history of other diseases of digestive system   . Type II or unspecified type diabetes mellitus without mention of complication, not stated as uncontrolled   . Osteoarthrosis, unspecified whether generalized or localized, unspecified site   . Unspecified essential hypertension   . External hemorrhoids without mention of complication   . Benign neoplasm of colon     Dr Carlean Purl  . Barrett's esophagus   . Biliary acute pancreatitis 2005  . Hyperlipidemia     NMR lipoprofile 2008: LDL 131 (1260/397), HDL 68 TG 82  . PMR (polymyalgia rheumatica) 02/2011-06/2011  . Gait disturbance   . Other malaise and fatigue 04/27/2013  . PONV (postoperative nausea and vomiting)    Past Surgical History  Procedure  Laterality Date  . Appendectomy    . Total abdominal hysterectomy w/ bilateral salpingoophorectomy    . Dilation and curettage of uterus    . Cholecystectomy  2003    Dr.Rosenbower  . Scrofula-ectomy    . Colonoscopy w/ polypectomy    . Tonsillectomy    . Orif shoulder fracture  1980  . Back surgery    . Cystectomy      Left Neck  . Wrist fracture surgery    . Esophagogastroduodenoscopy      Multiple EGD's Dr.Gessner  . Esi  06/07/11     [facility]  . Cystoscopy      ? 2000; Dr Serita Butcher   Social History:  reports that she has never smoked. She has never used smokeless tobacco. She reports that she does not drink alcohol or use illicit drugs.  Allergies  Allergen Reactions  . Sulfonamide Derivatives     hallucinations  . Ciprofloxacin     Telephone 08/31/13 tongue swelling    Family History  Problem Relation Age of Onset  . Heart attack Father 41  . Diabetes Mother   . Osteoporosis Mother   . Hip fracture Mother   . Emphysema Mother     Non-smoker  . Heart attack Brother 50  . Cancer Brother     bone  . Emphysema Brother   . Lung cancer Sister   . Cancer Sister     lung  .  Bone cancer Other     Aunt-? M or P-Aunt  . Cancer Other     bone  . COPD Brother     Prior to Admission medications   Medication Sig Start Date End Date Taking? Authorizing Provider  acetaminophen (TYLENOL) 500 MG tablet Take 500 mg by mouth every 8 (eight) hours as needed for moderate pain.    Yes Historical Provider, MD  benazepril (LOTENSIN) 20 MG tablet TAKE 1 TABLET BY MOUTH EVERY DAY 12/07/13  Yes Hendricks Limes, MD  calcium-vitamin D (OSCAL WITH D) 500-200 MG-UNIT per tablet Take 1 tablet by mouth daily.     Yes Historical Provider, MD  Cholecalciferol (VITAMIN D3) 1000 UNITS CAPS Take 1,000 Units by mouth daily.    Yes Historical Provider, MD  famotidine (PEPCID) 20 MG tablet Take 20 mg by mouth daily.   Yes Historical Provider, MD  HYDROcodone-acetaminophen (NORCO/VICODIN) 5-325  MG per tablet Take 1 tablet by mouth every 6 (six) hours as needed for moderate pain or severe pain. 12/04/14  Yes Rolland Porter, MD  ibandronate (BONIVA) 150 MG tablet Take 150 mg by mouth every 30 (thirty) days. Take in the morning with a full glass of water, on an empty stomach, and do not take anything else by mouth or lie down for the next 30 min.   Yes Historical Provider, MD  levETIRAcetam (KEPPRA) 500 MG tablet TAKE 1 TABLET BY MOUTH TWICE A DAY 05/23/14  Yes Kathrynn Ducking, MD  Melatonin-Pyridoxine (MELATIN PO) Take 5 mg by mouth at bedtime.   Yes Historical Provider, MD  Multiple Vitamin (MULTIVITAMIN) tablet Take 1 tablet by mouth daily.     Yes Historical Provider, MD  polyethylene glycol (MIRALAX / GLYCOLAX) packet Take 17 g by mouth daily.   Yes Historical Provider, MD  sertraline (ZOLOFT) 25 MG tablet Take 75 mg by mouth daily.   Yes Historical Provider, MD  tiZANidine (ZANAFLEX) 2 MG tablet TAKE 1 TABLET BY MOUTH TWICE A DAY Patient not taking: Reported on 01/23/2015 12/27/13   Kathrynn Ducking, MD   Physical Exam: Filed Vitals:   01/23/15 1615 01/23/15 1630 01/23/15 1706 01/23/15 1742  BP: 150/73 146/75 165/71   Pulse: 59 58 61   Temp:    97.5 F (36.4 C)  TempSrc:      Resp: 18 19 21    SpO2: 98% 99% 98%     Wt Readings from Last 3 Encounters:  06/27/14 45.587 kg (100 lb 8 oz)  01/05/14 46.04 kg (101 lb 8 oz)  12/22/13 45.133 kg (99 lb 8 oz)    General:  Awake and pleasant;y confused Eyes: PERRL, normal lids, irises & conjunctiva ENT: lips dry Neck: no LAD, masses or thyromegaly Cardiovascular: RRR, no m/r/g. No LE edema. Respiratory: CTA bilaterally, no w/r/r. Normal respiratory effort. Abdomen: soft, ntnd Skin: no rash or induration seen on limited exam Musculoskeletal: grossly normal tone BUE/BLE Psychiatric: pleasantly confused Neurologic: hard of hearing but following commands          Labs on Admission:  Basic Metabolic Panel:  Recent Labs Lab  01/23/15 1458  NA 143  K 3.6  CL 106  CO2 29  GLUCOSE 122*  BUN 22*  CREATININE 0.86  CALCIUM 10.1   Liver Function Tests:  Recent Labs Lab 01/23/15 1458  AST 17  ALT 11*  ALKPHOS 59  BILITOT 0.5  PROT 5.8*  ALBUMIN 3.0*   No results for input(s): LIPASE, AMYLASE in the last 168 hours. No  results for input(s): AMMONIA in the last 168 hours. CBC:  Recent Labs Lab 01/23/15 1458  WBC 8.2  NEUTROABS 5.3  HGB 11.0*  HCT 35.7*  MCV 85.2  PLT 332   Cardiac Enzymes:  Recent Labs Lab 01/23/15 1458  TROPONINI <0.03    BNP (last 3 results) No results for input(s): BNP in the last 8760 hours.  ProBNP (last 3 results) No results for input(s): PROBNP in the last 8760 hours.  CBG: No results for input(s): GLUCAP in the last 168 hours.  Radiological Exams on Admission: Ct Head Wo Contrast  01/23/2015   CLINICAL DATA:  Change in mental status.  EXAM: CT HEAD WITHOUT CONTRAST  TECHNIQUE: Contiguous axial images were obtained from the base of the skull through the vertex without intravenous contrast.  COMPARISON:  11/18/2011  FINDINGS: Ventricles, cisterns and other CSF spaces are mildly prominent compatible with age related atrophic change. There is chronic ischemic microvascular disease present. There is no mass, mass effect, shift of midline structures or acute hemorrhage. There is no evidence to suggest acute infarction. Remaining bones and soft tissues are within normal.  IMPRESSION: No acute intracranial findings.  Age related atrophic change and chronic ischemic microvascular disease.   Electronically Signed   By: Marin Olp M.D.   On: 01/23/2015 14:37   Dg Chest Portable 1 View  01/23/2015   CLINICAL DATA:  81 year old who became acutely nonverbal at the nursing home earlier today. Acute onset of cough and chest congestion.  EXAM: PORTABLE CHEST - 1 VIEW  COMPARISON:  10/14/2013 and earlier.  FINDINGS: Cardiac silhouette moderately to markedly enlarged but stable.  Extensive mitral annular calcification. Thoracic aorta atherosclerotic, unchanged. Hilar and mediastinal contours otherwise unremarkable. Suboptimal inspiration accounts for crowding of the bronchovascular markings diffusely and for atelectasis in the left lower lobe and lingula. Densely calcified granuloma in the left lower lobe, unchanged. Mild pulmonary venous hypertension without overt edema.  IMPRESSION: 1. Suboptimal inspiration accounts for atelectasis in the left lower lobe and lingula. 2. Stable moderate to marked cardiomegaly. Mild pulmonary venous hypertension without overt edema.   Electronically Signed   By: Evangeline Dakin M.D.   On: 01/23/2015 14:46    EKG: Independently reviewed. Similar to previous  Assessment/Plan Active Problems:   UTI (lower urinary tract infection)   Altered mental state   UTI-  Await culture -rocephin  AMS -resolved but will check B12, EEG as urine marginally impressive but at patient's age and symptoms, will treat  Reported DM -BS < 100 -monitor and check HgbA1C   Code Status: DNR DVT Prophylaxis: Family Communication:  Disposition Plan:   Time spent: 46 min  Eulogio Bear [facility] Pager [phone]

## 2015-01-23 NOTE — ED Notes (Signed)
Patient transported to CT 

## 2015-01-24 ENCOUNTER — Observation Stay (HOSPITAL_COMMUNITY): Payer: Medicare Other

## 2015-01-24 ENCOUNTER — Encounter (HOSPITAL_COMMUNITY): Payer: Self-pay | Admitting: General Practice

## 2015-01-24 DIAGNOSIS — R1310 Dysphagia, unspecified: Secondary | ICD-10-CM | POA: Diagnosis present

## 2015-01-24 DIAGNOSIS — Z66 Do not resuscitate: Secondary | ICD-10-CM | POA: Diagnosis present

## 2015-01-24 DIAGNOSIS — I10 Essential (primary) hypertension: Secondary | ICD-10-CM | POA: Diagnosis present

## 2015-01-24 DIAGNOSIS — B961 Klebsiella pneumoniae [K. pneumoniae] as the cause of diseases classified elsewhere: Secondary | ICD-10-CM | POA: Diagnosis present

## 2015-01-24 DIAGNOSIS — M1990 Unspecified osteoarthritis, unspecified site: Secondary | ICD-10-CM | POA: Diagnosis present

## 2015-01-24 DIAGNOSIS — E785 Hyperlipidemia, unspecified: Secondary | ICD-10-CM | POA: Diagnosis present

## 2015-01-24 DIAGNOSIS — F0390 Unspecified dementia without behavioral disturbance: Secondary | ICD-10-CM | POA: Diagnosis present

## 2015-01-24 DIAGNOSIS — Z8249 Family history of ischemic heart disease and other diseases of the circulatory system: Secondary | ICD-10-CM | POA: Diagnosis not present

## 2015-01-24 DIAGNOSIS — Z993 Dependence on wheelchair: Secondary | ICD-10-CM | POA: Diagnosis not present

## 2015-01-24 DIAGNOSIS — M353 Polymyalgia rheumatica: Secondary | ICD-10-CM | POA: Diagnosis present

## 2015-01-24 DIAGNOSIS — G9341 Metabolic encephalopathy: Secondary | ICD-10-CM

## 2015-01-24 DIAGNOSIS — H409 Unspecified glaucoma: Secondary | ICD-10-CM | POA: Diagnosis present

## 2015-01-24 DIAGNOSIS — G40802 Other epilepsy, not intractable, without status epilepticus: Secondary | ICD-10-CM

## 2015-01-24 DIAGNOSIS — R4182 Altered mental status, unspecified: Secondary | ICD-10-CM | POA: Diagnosis not present

## 2015-01-24 DIAGNOSIS — G9340 Encephalopathy, unspecified: Secondary | ICD-10-CM | POA: Diagnosis present

## 2015-01-24 DIAGNOSIS — Z825 Family history of asthma and other chronic lower respiratory diseases: Secondary | ICD-10-CM | POA: Diagnosis not present

## 2015-01-24 DIAGNOSIS — Z833 Family history of diabetes mellitus: Secondary | ICD-10-CM | POA: Diagnosis not present

## 2015-01-24 DIAGNOSIS — N390 Urinary tract infection, site not specified: Secondary | ICD-10-CM | POA: Diagnosis not present

## 2015-01-24 DIAGNOSIS — M810 Age-related osteoporosis without current pathological fracture: Secondary | ICD-10-CM | POA: Diagnosis present

## 2015-01-24 DIAGNOSIS — E1165 Type 2 diabetes mellitus with hyperglycemia: Secondary | ICD-10-CM | POA: Diagnosis present

## 2015-01-24 DIAGNOSIS — Z808 Family history of malignant neoplasm of other organs or systems: Secondary | ICD-10-CM | POA: Diagnosis not present

## 2015-01-24 DIAGNOSIS — Z90710 Acquired absence of both cervix and uterus: Secondary | ICD-10-CM | POA: Diagnosis not present

## 2015-01-24 DIAGNOSIS — N3090 Cystitis, unspecified without hematuria: Secondary | ICD-10-CM | POA: Diagnosis not present

## 2015-01-24 DIAGNOSIS — Z801 Family history of malignant neoplasm of trachea, bronchus and lung: Secondary | ICD-10-CM | POA: Diagnosis not present

## 2015-01-24 DIAGNOSIS — G40409 Other generalized epilepsy and epileptic syndromes, not intractable, without status epilepticus: Secondary | ICD-10-CM | POA: Diagnosis present

## 2015-01-24 DIAGNOSIS — R404 Transient alteration of awareness: Secondary | ICD-10-CM | POA: Diagnosis not present

## 2015-01-24 HISTORY — DX: Urinary tract infection, site not specified: N39.0

## 2015-01-24 LAB — CBC
HCT: 38 % (ref 36.0–46.0)
Hemoglobin: 11.8 g/dL — ABNORMAL LOW (ref 12.0–15.0)
MCH: 26.7 pg (ref 26.0–34.0)
MCHC: 31.1 g/dL (ref 30.0–36.0)
MCV: 86 fL (ref 78.0–100.0)
PLATELETS: 300 10*3/uL (ref 150–400)
RBC: 4.42 MIL/uL (ref 3.87–5.11)
RDW: 15.7 % — AB (ref 11.5–15.5)
WBC: 7.4 10*3/uL (ref 4.0–10.5)

## 2015-01-24 LAB — BASIC METABOLIC PANEL
Anion gap: 7 (ref 5–15)
BUN: 18 mg/dL (ref 6–20)
CALCIUM: 9.2 mg/dL (ref 8.9–10.3)
CHLORIDE: 106 mmol/L (ref 101–111)
CO2: 28 mmol/L (ref 22–32)
CREATININE: 0.76 mg/dL (ref 0.44–1.00)
Glucose, Bld: 96 mg/dL (ref 65–99)
Potassium: 3.5 mmol/L (ref 3.5–5.1)
SODIUM: 141 mmol/L (ref 135–145)

## 2015-01-24 MED ORDER — HYDRALAZINE HCL 20 MG/ML IJ SOLN
5.0000 mg | Freq: Once | INTRAMUSCULAR | Status: AC
  Administered 2015-01-24: 5 mg via INTRAVENOUS
  Filled 2015-01-24: qty 1

## 2015-01-24 MED ORDER — WHITE PETROLATUM GEL
Status: AC
  Administered 2015-01-24: 0.2
  Filled 2015-01-24: qty 1

## 2015-01-24 MED ORDER — HYDRALAZINE HCL 20 MG/ML IJ SOLN
5.0000 mg | Freq: Four times a day (QID) | INTRAMUSCULAR | Status: DC | PRN
  Administered 2015-01-24 – 2015-01-25 (×2): 5 mg via INTRAVENOUS
  Filled 2015-01-24 (×3): qty 1

## 2015-01-24 NOTE — Progress Notes (Signed)
EEG Completed; Results Pending  

## 2015-01-24 NOTE — Progress Notes (Signed)
[facility] PROGRESS NOTE  Shelley Navarro YJE:[id number] DOB: 11/14/1920 DOA: 01/23/2015 PCP: Unice Cobble, MD  Assessment/Plan:  Active Problems:   UTI (lower urinary tract infection) - Pt currently on Rocephin    Altered mental state - improving with IVF and antibiotics  - Vit B 12 within normal limits - obtain speech therapy evaluation  Hyperglycemia - place on diabetic diet once patient passes swallow screen  Generalized nonconvulsive epilepsy -Stable continue anti-epileptic medication  Code Status: DNR Family Communication: None at bedside Disposition Plan: pending continued improvement in mentation.   Consultants:  None  Procedures:  None  Antibiotics:  Rocephin  HPI/Subjective: Patient has no new complaints. States she feels better  Objective: Filed Vitals:   01/24/15 1330  BP: 145/56  Pulse: 64  Temp: 97.6 F (36.4 C)  Resp: 18    Intake/Output Summary (Last 24 hours) at 01/24/15 1729 Last data filed at 01/24/15 1648  Gross per 24 hour  Intake    900 ml  Output      0 ml  Net    900 ml   Filed Weights   01/23/15 2005  Weight: 49.5 kg (109 lb 2 oz)    Exam:   General:  Patient in no acute distress, alert and awake  Cardiovascular: Regular rate and rhythm, no murmurs or rubs  Respiratory: Clear to auscultation bilaterally, no wheezes  Abdomen: Soft, nondistended, nontender  Musculoskeletal: No cyanosis or clubbing on limited exam   Data Reviewed: Basic Metabolic Panel:  Recent Labs Lab 01/23/15 1458 01/24/15 0644  NA 143 141  K 3.6 3.5  CL 106 106  CO2 29 28  GLUCOSE 122* 96  BUN 22* 18  CREATININE 0.86 0.76  CALCIUM 10.1 9.2   Liver Function Tests:  Recent Labs Lab 01/23/15 1458  AST 17  ALT 11*  ALKPHOS 59  BILITOT 0.5  PROT 5.8*  ALBUMIN 3.0*   No results for input(s): LIPASE, AMYLASE in the last 168 hours. No results for input(s): AMMONIA in the last 168 hours. CBC:  Recent Labs Lab  01/23/15 1458 01/24/15 0644  WBC 8.2 7.4  NEUTROABS 5.3  --   HGB 11.0* 11.8*  HCT 35.7* 38.0  MCV 85.2 86.0  PLT 332 300   Cardiac Enzymes:  Recent Labs Lab 01/23/15 1458  TROPONINI <0.03   BNP (last 3 results) No results for input(s): BNP in the last 8760 hours.  ProBNP (last 3 results) No results for input(s): PROBNP in the last 8760 hours.  CBG: No results for input(s): GLUCAP in the last 168 hours.  Recent Results (from the past 240 hour(s))  Urine culture     Status: None (Preliminary result)   Collection Time: 01/23/15  3:15 PM  Result Value Ref Range Status   Specimen Description URINE, CATHETERIZED  Final   Special Requests NONE  Final   Culture >=100,000 COLONIES/mL GRAM NEGATIVE RODS  Final   Report Status PENDING  Incomplete     Studies: Ct Head Wo Contrast  01/23/2015   CLINICAL DATA:  Change in mental status.  EXAM: CT HEAD WITHOUT CONTRAST  TECHNIQUE: Contiguous axial images were obtained from the base of the skull through the vertex without intravenous contrast.  COMPARISON:  11/18/2011  FINDINGS: Ventricles, cisterns and other CSF spaces are mildly prominent compatible with age related atrophic change. There is chronic ischemic microvascular disease present. There is no mass, mass effect, shift of midline structures or acute hemorrhage. There is no evidence to suggest acute  infarction. Remaining bones and soft tissues are within normal.  IMPRESSION: No acute intracranial findings.  Age related atrophic change and chronic ischemic microvascular disease.   Electronically Signed   By: Marin Olp M.D.   On: 01/23/2015 14:37   Dg Chest Portable 1 View  01/23/2015   CLINICAL DATA:  81 year old who became acutely nonverbal at the nursing home earlier today. Acute onset of cough and chest congestion.  EXAM: PORTABLE CHEST - 1 VIEW  COMPARISON:  10/14/2013 and earlier.  FINDINGS: Cardiac silhouette moderately to markedly enlarged but stable. Extensive mitral  annular calcification. Thoracic aorta atherosclerotic, unchanged. Hilar and mediastinal contours otherwise unremarkable. Suboptimal inspiration accounts for crowding of the bronchovascular markings diffusely and for atelectasis in the left lower lobe and lingula. Densely calcified granuloma in the left lower lobe, unchanged. Mild pulmonary venous hypertension without overt edema.  IMPRESSION: 1. Suboptimal inspiration accounts for atelectasis in the left lower lobe and lingula. 2. Stable moderate to marked cardiomegaly. Mild pulmonary venous hypertension without overt edema.   Electronically Signed   By: Evangeline Dakin M.D.   On: 01/23/2015 14:46    Scheduled Meds: . benazepril  20 mg Oral Daily  . cefTRIAXone (ROCEPHIN)  IV  1 g Intravenous Q24H  . enoxaparin (LOVENOX) injection  30 mg Subcutaneous Q24H  . famotidine  20 mg Oral Daily  . levETIRAcetam  500 mg Oral BID  . sertraline  75 mg Oral Daily  . sodium chloride  3 mL Intravenous Q12H   Continuous Infusions: . sodium chloride 50 mL/hr at 01/24/15 1647    Time spent: 15 minutes   Velvet Bathe  [facility] Pager [phone] If 7PM-7AM, please contact night-coverage at [email or web address], password [facility] 01/24/2015, 5:29 PM  LOS: 0 days

## 2015-01-24 NOTE — Procedures (Signed)
ELECTROENCEPHALOGRAM REPORT   Patient: Shelley Navarro      Age: 81 y.o.        Sex: female Referring Physician: Dr Eliseo Squires [facility] Report Date:  01/24/2015        Interpreting Physician: Hulen Luster  History: [name] is an 81 y.o. female admitted with AMS  Medications:  Scheduled: . benazepril  20 mg Oral Daily  . cefTRIAXone (ROCEPHIN)  IV  1 g Intravenous Q24H  . enoxaparin (LOVENOX) injection  30 mg Subcutaneous Q24H  . famotidine  20 mg Oral Daily  . levETIRAcetam  500 mg Oral BID  . sertraline  75 mg Oral Daily  . sodium chloride  3 mL Intravenous Q12H    Conditions of Recording:  This is a 16 channel EEG carried out with the patient in the awake state.  Description:  The waking background activity consists of a medium to low voltage, symmetrical, fairly well organized, mix of theta and delta activity, seen from the parieto-occipital and posterior temporal regions. No focal slowing or epileptiform activity noted.   The patient drowses with slowing to irregular, low voltage theta and beta activity. Hyperventilation was not performed. Intermittent photic stimulation was not performed.   IMPRESSION: This is an abnormal EEG secondary to general background slowing indicating a mild to moderate cerebral disturbance. No epileptiform activity was noted.     Jim Like, DO [facility] [phone]  If 7pm- 7am, please page neurology on call as listed in [facility]. 01/24/2015, 3:13 PM

## 2015-01-25 DIAGNOSIS — F0390 Unspecified dementia without behavioral disturbance: Secondary | ICD-10-CM

## 2015-01-25 DIAGNOSIS — B9689 Other specified bacterial agents as the cause of diseases classified elsewhere: Secondary | ICD-10-CM | POA: Diagnosis present

## 2015-01-25 DIAGNOSIS — F03A0 Unspecified dementia, mild, without behavioral disturbance, psychotic disturbance, mood disturbance, and anxiety: Secondary | ICD-10-CM | POA: Diagnosis present

## 2015-01-25 DIAGNOSIS — N3090 Cystitis, unspecified without hematuria: Secondary | ICD-10-CM | POA: Diagnosis present

## 2015-01-25 DIAGNOSIS — B961 Klebsiella pneumoniae [K. pneumoniae] as the cause of diseases classified elsewhere: Secondary | ICD-10-CM | POA: Diagnosis present

## 2015-01-25 DIAGNOSIS — N390 Urinary tract infection, site not specified: Principal | ICD-10-CM

## 2015-01-25 DIAGNOSIS — G9340 Encephalopathy, unspecified: Secondary | ICD-10-CM

## 2015-01-25 DIAGNOSIS — I10 Essential (primary) hypertension: Secondary | ICD-10-CM | POA: Diagnosis present

## 2015-01-25 LAB — URINE CULTURE: Culture: >=100000

## 2015-01-25 LAB — HEMOGLOBIN A1C
Hgb A1c MFr Bld: 6 % — ABNORMAL HIGH (ref 4.8–5.6)
Mean Plasma Glucose: 126 mg/dL

## 2015-01-25 MED ORDER — CEPHALEXIN 500 MG PO CAPS: 500.0000 mg | ORAL_CAPSULE | Freq: Two times a day (BID) | ORAL | Status: AC

## 2015-01-25 MED ORDER — HYDRALAZINE HCL 20 MG/ML IJ SOLN
10.0000 mg | Freq: Once | INTRAMUSCULAR | Status: AC
  Administered 2015-01-25: 10 mg via INTRAVENOUS

## 2015-01-25 MED ORDER — HALOPERIDOL LACTATE 5 MG/ML IJ SOLN
1.0000 mg | Freq: Once | INTRAMUSCULAR | Status: AC
  Administered 2015-01-25: 1 mg via INTRAVENOUS
  Filled 2015-01-25: qty 1

## 2015-01-25 MED ORDER — MEMANTINE HCL 10 MG PO TABS
5.0000 mg | ORAL_TABLET | Freq: Every day | ORAL | Status: DC
Start: 2015-01-25 — End: 2015-01-25
  Administered 2015-01-25: 5 mg via ORAL
  Filled 2015-01-25: qty 1

## 2015-01-25 MED ORDER — AMLODIPINE BESYLATE 5 MG PO TABS: 5.0000 mg | ORAL_TABLET | Freq: Every day | ORAL | Status: AC

## 2015-01-25 NOTE — Discharge Summary (Addendum)
Physician Discharge Summary  Shelley Navarro ACZ:[id number] DOB: 1921/03/22 DOA: 01/23/2015  PCP: Unice Cobble, MD  Admit date: 01/23/2015 Discharge date: 01/25/2015  Time spent: 25 minutes  Recommendations for Outpatient Follow-up:  1.Patient will be discharged back to skilled nursing facility. 2.Patient will complete a total 7 days of antibiotics with Keflex (until 01/24/2015) 3. Patient has mild underlying dementia. 4. Depression blood pressure is elevated and have added 5 mg of amlodipine daily. Please monitor blood pressure closely and titrate dose as needed.  Discharge Diagnoses:  Principal Problem:   Acute encephalopathy secondary to Klebsiella UTI   Active Problems:   Recurrent UTI   Mild dementia  Essential hypertension   Discharge Condition: fair  Diet recommendation: Dysphagia level III ( mechanical soft) with thin liquid  Filed Weights   01/23/15 2005  Weight: 49.5 kg (109 lb 2 oz)    History of present illness:  Please refer to admission H&P for details, in brief, 81 year old female resident of skilled nursing facility with history of hypertension, history of seizures, anemia, prediabetes who had baseline is wheelchair-bound and has mild dementia symptoms that her daughter noticed Early this year was brought to the ED after she was nonverbal and unable to follow commands. As per daughter her mental status has progressively declined and she has been increasingly confused since 4 days prior to admission. No history of fall, loss of consciousness or fever reported. No history of chills, nausea, vomiting, diarrhea . She did report dysuria.. In the ED she was reportedly more awake and conversant.  UA was suggestive of UTI. Initial symptoms were concerning for CVA versus seizures . CT was negative for acute findings. Patient admitted for further management.  Hospital Course:  Acute encephalopathy Appears to be secondary to Klebsiella UTI. Patient has had  recurrent UTI in the past with Klebsiella and Escherichia coli. Culture is mostly sensitive. Patient remains afebrile and her mental status appears to be improving hydration and antibiotics. She does have mild underlying dementia which needs to be followed up as outpatient. B12 was normal. Patient will be discharged on oral Keflex to complete total 7 days of antibiotics and follow-up at the facility.  Generalized nonconvulsive epilepsy Continued home dose Keppra. EEG done was negative for acute seizure activity.  Prediabetes A1c of 6. Stable on sliding scale insulin.  Uncontrolled hypertension Systolic Blood pressure elevated to 180-238mmhg. continue Lotensin and added low-dose amlodipine. Monitor blood pressure as outpatient and titrate dose.  Dysphagia Seen by speech and swallow and recommend dysphagia level III diet.   Procedures:  Head CT  EEG    Consultations:  None   CODE STATUS: DO NOT RESUSCITATE  Family communication: Discussed in detail with the daughter read on the phone.  Disposition: Return to skilled nursing facility.  Discharge Exam: Filed Vitals:   01/25/15 0526  BP: 131/75  Pulse: 85  Temp: 97.9 F (36.6 C)  Resp: 18    General: Elderly female in no acute distress HEENT: No pallor, moist oral mucosa, supple neck Chest: Clear to auscultation bilaterally, no added sounds CVS: Normal S1 and S2, no murmurs rub or gallop GI: Soft, nondistended, nontender, bowel sounds present Musculoskeletal: Warm, no edema CNS: Alert and oriented x 1-2, Non focal   Discharge Instructions    Current Discharge Medication List    START taking these medications   Details  cephALEXin (KEFLEX) 500 MG capsule Take 1 capsule (500 mg total) by mouth 2 (two) times daily. Qty: 12 capsule, Refills: 0  Amlodipine  5 mg tablet                         Take 1 tablet (5mg ) by mouth daily                                                                     Qty: 30 tablets,  refills:0   CONTINUE these medications which have NOT CHANGED   Details  acetaminophen (TYLENOL) 500 MG tablet Take 500 mg by mouth every 8 (eight) hours as needed for moderate pain.     benazepril (LOTENSIN) 20 MG tablet TAKE 1 TABLET BY MOUTH EVERY DAY Qty: 90 tablet, Refills: 1    calcium-vitamin D (OSCAL WITH D) 500-200 MG-UNIT per tablet Take 1 tablet by mouth daily.      Cholecalciferol (VITAMIN D3) 1000 UNITS CAPS Take 1,000 Units by mouth daily.     famotidine (PEPCID) 20 MG tablet Take 20 mg by mouth daily.    ibandronate (BONIVA) 150 MG tablet Take 150 mg by mouth every 30 (thirty) days. Take in the morning with a full glass of water, on an empty stomach, and do not take anything else by mouth or lie down for the next 30 min.    levETIRAcetam (KEPPRA) 500 MG tablet TAKE 1 TABLET BY MOUTH TWICE A DAY Qty: 60 tablet, Refills: 1    Melatonin-Pyridoxine (MELATIN PO) Take 5 mg by mouth at bedtime.    Multiple Vitamin (MULTIVITAMIN) tablet Take 1 tablet by mouth daily.      polyethylene glycol (MIRALAX / GLYCOLAX) packet Take 17 g by mouth daily.    sertraline (ZOLOFT) 25 MG tablet Take 75 mg by mouth daily.      STOP taking these medications     HYDROcodone-acetaminophen (NORCO/VICODIN) 5-325 MG per tablet      tiZANidine (ZANAFLEX) 2 MG tablet        Allergies  Allergen Reactions  . Sulfonamide Derivatives     hallucinations  . Ciprofloxacin     Telephone 08/31/13 tongue swelling   Follow-up Information    Please follow up.   Why:  MD at SNF in 1 week       The results of significant diagnostics from this hospitalization (including imaging, microbiology, ancillary and laboratory) are listed below for reference.    Significant Diagnostic Studies: Ct Head Wo Contrast  01/23/2015   CLINICAL DATA:  Change in mental status.  EXAM: CT HEAD WITHOUT CONTRAST  TECHNIQUE: Contiguous axial images were obtained from the base of the skull through the vertex without  intravenous contrast.  COMPARISON:  11/18/2011  FINDINGS: Ventricles, cisterns and other CSF spaces are mildly prominent compatible with age related atrophic change. There is chronic ischemic microvascular disease present. There is no mass, mass effect, shift of midline structures or acute hemorrhage. There is no evidence to suggest acute infarction. Remaining bones and soft tissues are within normal.  IMPRESSION: No acute intracranial findings.  Age related atrophic change and chronic ischemic microvascular disease.   Electronically Signed   By: Marin Olp M.D.   On: 01/23/2015 14:37   Dg Chest Portable 1 View  01/23/2015   CLINICAL DATA:  81 year old who became acutely nonverbal at the nursing home earlier  today. Acute onset of cough and chest congestion.  EXAM: PORTABLE CHEST - 1 VIEW  COMPARISON:  10/14/2013 and earlier.  FINDINGS: Cardiac silhouette moderately to markedly enlarged but stable. Extensive mitral annular calcification. Thoracic aorta atherosclerotic, unchanged. Hilar and mediastinal contours otherwise unremarkable. Suboptimal inspiration accounts for crowding of the bronchovascular markings diffusely and for atelectasis in the left lower lobe and lingula. Densely calcified granuloma in the left lower lobe, unchanged. Mild pulmonary venous hypertension without overt edema.  IMPRESSION: 1. Suboptimal inspiration accounts for atelectasis in the left lower lobe and lingula. 2. Stable moderate to marked cardiomegaly. Mild pulmonary venous hypertension without overt edema.   Electronically Signed   By: Evangeline Dakin M.D.   On: 01/23/2015 14:46    Microbiology: Recent Results (from the past 240 hour(s))  Urine culture     Status: None   Collection Time: 01/23/15  3:15 PM  Result Value Ref Range Status   Specimen Description URINE, CATHETERIZED  Final   Special Requests NONE  Final   Culture >=100,000 COLONIES/mL KLEBSIELLA PNEUMONIAE  Final   Report Status 01/25/2015 FINAL  Final    Organism ID, Bacteria KLEBSIELLA PNEUMONIAE  Final      Susceptibility   Klebsiella pneumoniae - MIC*    AMPICILLIN >=32 RESISTANT Resistant     CEFAZOLIN <=4 SENSITIVE Sensitive     CEFTRIAXONE <=1 SENSITIVE Sensitive     CIPROFLOXACIN <=0.25 SENSITIVE Sensitive     GENTAMICIN <=1 SENSITIVE Sensitive     IMIPENEM <=0.25 SENSITIVE Sensitive     NITROFURANTOIN 32 SENSITIVE Sensitive     TRIMETH/SULFA <=20 SENSITIVE Sensitive     AMPICILLIN/SULBACTAM >=32 RESISTANT Resistant     PIP/TAZO <=4 SENSITIVE Sensitive     * >=100,000 COLONIES/mL KLEBSIELLA PNEUMONIAE     Labs: Basic Metabolic Panel:  Recent Labs Lab 01/23/15 1458 01/24/15 0644  NA 143 141  K 3.6 3.5  CL 106 106  CO2 29 28  GLUCOSE 122* 96  BUN 22* 18  CREATININE 0.86 0.76  CALCIUM 10.1 9.2   Liver Function Tests:  Recent Labs Lab 01/23/15 1458  AST 17  ALT 11*  ALKPHOS 59  BILITOT 0.5  PROT 5.8*  ALBUMIN 3.0*   No results for input(s): LIPASE, AMYLASE in the last 168 hours. No results for input(s): AMMONIA in the last 168 hours. CBC:  Recent Labs Lab 01/23/15 1458 01/24/15 0644  WBC 8.2 7.4  NEUTROABS 5.3  --   HGB 11.0* 11.8*  HCT 35.7* 38.0  MCV 85.2 86.0  PLT 332 300   Cardiac Enzymes:  Recent Labs Lab 01/23/15 1458  TROPONINI <0.03   BNP: BNP (last 3 results) No results for input(s): BNP in the last 8760 hours.  ProBNP (last 3 results) No results for input(s): PROBNP in the last 8760 hours.  CBG: No results for input(s): GLUCAP in the last 168 hours.     SignedLouellen Molder  [facility] 01/25/2015, 10:59 AM

## 2015-01-25 NOTE — Progress Notes (Addendum)
Dr. Clementeen Graham paged about BP 180/64 30 minutes following PRN hydralazine administration for BP of 210/72 HR 78. Awaiting orders. Patient denies symptoms and no shortness of breath noted. MD stated he would place orders.   BP rechecked 160/60 and Dr. Clementeen Graham made aware. Orders placed for new RX and MD verbal order okay to discharge.

## 2015-01-25 NOTE — Evaluation (Signed)
Clinical/Bedside Swallow Evaluation Patient Details  Name: Shelley Navarro MRN: [id number] Date of Birth: [birth year]  Today's Date: 01/25/2015 Time: SLP Start Time (ACUTE ONLY): 0930 SLP Stop Time (ACUTE ONLY): 0944 SLP Time Calculation (min) (ACUTE ONLY): 14 min  Past Medical History:  Past Medical History  Diagnosis Date  . Anemia   . Glaucoma   . Diverticulitis 1995  . Osteoporosis     s/p wrist and shoulder fractures  . Right bundle branch block   . Granulomatous lung disease     on xray  . Personal history of unspecified disease   . Personal history of other diseases of digestive system   . Type II or unspecified type diabetes mellitus without mention of complication, not stated as uncontrolled   . Osteoarthrosis, unspecified whether generalized or localized, unspecified site   . Unspecified essential hypertension   . External hemorrhoids without mention of complication   . Benign neoplasm of colon     Dr Carlean Purl  . Barrett's esophagus   . Biliary acute pancreatitis 2005  . Hyperlipidemia     NMR lipoprofile 2008: LDL 131 (1260/397), HDL 68 TG 82  . PMR (polymyalgia rheumatica) 02/2011-06/2011  . Gait disturbance   . Other malaise and fatigue 04/27/2013  . PONV (postoperative nausea and vomiting)   . UTI (urinary tract infection) 01/24/2015  . GERD (gastroesophageal reflux disease)   . Seizures    Past Surgical History:  Past Surgical History  Procedure Laterality Date  . Appendectomy    . Total abdominal hysterectomy w/ bilateral salpingoophorectomy    . Dilation and curettage of uterus    . Cholecystectomy  2003    Dr.Rosenbower  . Scrofula-ectomy    . Colonoscopy w/ polypectomy    . Tonsillectomy    . Orif shoulder fracture  1980  . Back surgery    . Cystectomy      Left Neck  . Wrist fracture surgery    . Esophagogastroduodenoscopy      Multiple EGD's Dr.Gessner  . Esi  06/07/11     [facility]  . Cystoscopy      ? 2000; Dr Serita Butcher   HPI:   Shelley Navarro is a 81 y.o. female with h/o DM, RBBB and anemia who presents from SNF for AMS. U/A suggestive of UTI.    Assessment / Plan / Recommendation Clinical Impression  Pt has rotary mastication and disorganized lingual manipulation of solid POs, leading to prolonged formation and mild residuals that are reduced with SLP administered liquid washes. No signs of aspiration are observed, but would recommend downgrade to Dys 3 textures, continuing thin liquids, to increase efifciency with oral phase particularly given AMS. SLP to follow briefly for tolerance.     Aspiration Risk  Mild    Diet Recommendation Dysphagia 3 (Mech soft);Thin   Medication Administration: Whole meds with puree Compensations: Minimize environmental distractions;Slow rate;Small sips/bites;Follow solids with liquid    Other  Recommendations Oral Care Recommendations: Oral care BID   Frequency and Duration min 1 x/week  1 week   Pertinent Vitals/Pain n/a    SLP Swallow Goals     Swallow Study Prior Functional Status       General Other Pertinent Information: Shelley Navarro is a 81 y.o. female with h/o DM, RBBB and anemia who presents from SNF for AMS. U/A suggestive of UTI.  Type of Study: Bedside swallow evaluation Previous Swallow Assessment: none in chart Diet Prior to this Study: Regular;Thin liquids Temperature Spikes Noted:  No Respiratory Status: Room air History of Recent Intubation: No Behavior/Cognition: Alert;Cooperative;Confused;Requires cueing Oral Cavity - Dentition: Adequate natural dentition/normal for age Self-Feeding Abilities: Needs assist Patient Positioning: Upright in chair/Tumbleform Baseline Vocal Quality: Normal    Oral/Motor/Sensory Function Overall Oral Motor/Sensory Function: Appears within functional limits for tasks assessed   Ice Chips Ice chips: Not tested   Thin Liquid Thin Liquid: Within functional limits Presentation: Straw    Nectar Thick Nectar  Thick Liquid: Not tested   Honey Thick Honey Thick Liquid: Not tested   Puree Puree: Not tested   Solid    Solid: Impaired Oral Phase Impairments: Impaired anterior to posterior transit;Impaired mastication Oral Phase Functional Implications: Oral residue      Germain Osgood, M.A. CCC-SLP [phone]  Germain Osgood 01/25/2015,9:58 AM

## 2015-01-25 NOTE — Clinical Social Work Note (Signed)
Clinical Social Work Assessment  Patient Details  Name: Shelley Navarro MRN: [id number] Date of Birth: 1920-07-28  Date of referral:  01/25/15               Reason for consult:  Facility Placement, Discharge Planning                Permission sought to share information with:  Facility Sport and exercise psychologist, Family Supports Permission granted to share information::  Yes, Verbal Permission Granted  Name::     Roselee Culver  Agency::  [facility]  Relationship::  Niece  Contact Information:  [phone]  Housing/Transportation Living arrangements for the past 2 months:  [place] of Information:  Other (Comment Required) (Family- niece) Patient Interpreter Needed:  None Criminal Activity/Legal Involvement Pertinent to Current Situation/Hospitalization:  No - Comment as needed Significant Relationships:  Other Family Members Lives with:  Facility Resident Do you feel safe going back to the place where you live?  Yes Need for family participation in patient care:  Yes (Comment) (Patient's family active in patient's care.)  Care giving concerns:  Patient's family expressed no concerns at this time.   Social Worker assessment / plan:  CSW received referral patient admitted from [facility] SNF. CSW confirmed information with patient's niece, Roselee Culver. Per patient's niece, patient and patient's husband are both long-term residents at [facility] and patient's family anticipates patient to return once medically stable. CSW to continue to follow and assist with discharge planning needs.  Employment status:  Retired Forensic scientist:  Medicare PT Recommendations:  Not assessed at this time [place] / Referral to community resources:  [place]  Patient/Family's Response to care:  Patient's family understanding and agreeable to CSW plan of care.  Patient/Family's Understanding of and Emotional Response to Diagnosis, Current Treatment, and  Prognosis:  Patient's family understanding and agreeable to CSW plan of care.  Emotional Assessment Appearance:  Other (Comment Required (CSW spoke with patient's niece as patient oriented to self.) Attitude/Demeanor/Rapport:  Other (CSW spoke with patient's niece as patient oriented to self.) Affect (typically observed):  Other (CSW spoke with patient's niece as patient oriented to self.) Orientation:  Oriented to Self Alcohol / Substance use:  Not Applicable Psych involvement (Current and /or in the community):  No (Comment) (Not appropriate on this admission.)  Discharge Needs  Concerns to be addressed:  No discharge needs identified Readmission within the last 30 days:  No Current discharge risk:  None Barriers to Discharge:  No Barriers Identified   Caroline Sauger, LCSW 01/25/2015, 12:26 PM [phone]

## 2015-01-25 NOTE — Progress Notes (Signed)
NURSING PROGRESS NOTE  Shelley Navarro [id number] Discharge Data: 01/25/2015 2:16 PM Attending Provider: Louellen Molder, MD [id number] [name], MD     Irine Seal to be D/C'd Skilled nursing facility per MD order.  Discussed with the patient the After Visit Summary and all questions fully answered. All IV's discontinued with no bleeding noted. All belongings returned to patient for patient to take to [facility]. Pt transferred via stretcher by [facility]AR. Report called to Deneise Lever, nurse at [facility] with all questions answered.  Last Vital Signs:  Blood pressure 160/60, pulse 78, temperature 97.9 F (36.6 C), temperature source Oral, resp. rate 18, weight 49.5 kg (109 lb 2 oz), SpO2 94 %.  Discharge Medication List   Medication List    STOP taking these medications        HYDROcodone-acetaminophen 5-325 MG per tablet  Commonly known as:  NORCO/VICODIN     tiZANidine 2 MG tablet  Commonly known as:  ZANAFLEX      TAKE these medications        acetaminophen 500 MG tablet  Commonly known as:  TYLENOL  Take 500 mg by mouth every 8 (eight) hours as needed for moderate pain.     amLODipine 5 MG tablet  Commonly known as:  NORVASC  Take 1 tablet (5 mg total) by mouth daily.     benazepril 20 MG tablet  Commonly known as:  LOTENSIN  TAKE 1 TABLET BY MOUTH EVERY DAY     calcium-vitamin D 500-200 MG-UNIT per tablet  Commonly known as:  OSCAL WITH D  Take 1 tablet by mouth daily.     cephALEXin 500 MG capsule  Commonly known as:  KEFLEX  Take 1 capsule (500 mg total) by mouth 2 (two) times daily.     famotidine 20 MG tablet  Commonly known as:  PEPCID  Take 20 mg by mouth daily.     ibandronate 150 MG tablet  Commonly known as:  BONIVA  Take 150 mg by mouth every 30 (thirty) days. Take in the morning with a full glass of water, on an empty stomach, and do not take anything else by mouth or lie down for the next 30 min.     levETIRAcetam 500 MG tablet  Commonly  known as:  KEPPRA  TAKE 1 TABLET BY MOUTH TWICE A DAY     MELATIN PO  Take 5 mg by mouth at bedtime.     multivitamin tablet  Take 1 tablet by mouth daily.     polyethylene glycol packet  Commonly known as:  MIRALAX / GLYCOLAX  Take 17 g by mouth daily.     sertraline 25 MG tablet  Commonly known as:  ZOLOFT  Take 75 mg by mouth daily.     Vitamin D3 1000 UNITS Caps  Take 1,000 Units by mouth daily.         Charolette Child, RN

## 2015-01-25 NOTE — Clinical Documentation Improvement (Signed)
Hospitalist  Can the diagnosis of altered mental status be further specified?   Confusion/delirium (including drug induced)  Drowsiness/somnolence  Stupor/Semi-coma  Transient alteration of awareness  Encephalopathy - Alcoholic, Anoxic/Hypoxia, Drug Induced/Toxic (specify drug), Hepatic, Hypertensive, Hypoglycemic, Metabolic/Septic, Traumatic/post concussive, Wernicke, Other  Other  Clinically Undetermined  Document any associated diagnoses/conditions.  Supporting Information: 01/24/15 progr note.Marland KitchenMarland Kitchen"Altered mental state - improving with IVF and antibiotics  - Vit B 12 within normal limits - obtain speech therapy evaluation"  Please exercise your independent, professional judgment when responding. A specific answer is not anticipated or expected.  Thank You,  Ermelinda Das, RN, BSN, CCDS Certified Clinical Documentation Specialist Pager: [place]: Health Information Management

## 2015-01-25 NOTE — Care Management Note (Signed)
Case Management Note  Patient Details  Name: Shelley Navarro MRN: [id number] Date of Birth: 1920/07/29  Subjective/Objective:                 Patient admitted from [facility], will return today, facilitated through [place].   Action/Plan:   Expected Discharge Date:                  Expected Discharge Plan:  Skilled Nursing Facility  In-House Referral:  Clinical Social Work  Discharge planning Services  CM Consult  Post Acute Care Choice:    Choice offered to:     DME Arranged:    DME Agency:     HH Arranged:    [place] Agency:     Status of Service:  In process, will continue to follow  Medicare Important Message Given:    Date Medicare IM Given:    Medicare IM give by:    Date Additional Medicare IM Given:    Additional Medicare Important Message give by:     If discussed at [place] of Stay Meetings, dates discussed:    Additional Comments:  Carles Collet, RN 01/25/2015, 11:34 AM

## 2015-01-25 NOTE — Evaluation (Signed)
Physical Therapy Evaluation Patient Details Name: Shelley Navarro MRN: [id number] DOB: Jun 29, 1920 Today's Date: 01/25/2015   History of Present Illness  Patient is a 81 y/o admitted with AMS positive for UTI.  PMH positive for RBBB, DM, and anemia.  Clinical Impression  Patient presents with dependencies in mobility and unsure if a change from her recent baseline due to no family available.  Feel evaluation by PT staff at nursing facility she is from would be better to determine if her mobility has changed significantly from her baseline and if skilled PT is indicated at this time.  No further skilled acute PT needs at this time due to d/c pending.    Follow Up Recommendations SNF    Equipment Recommendations       Recommendations for Other Services       Precautions / Restrictions Precautions Precautions: Fall      Mobility  Bed Mobility Overal bed mobility: Needs Assistance Bed Mobility: Sit to Supine;Sit to Sidelying         Sit to sidelying: Max assist;+2 for physical assistance General bed mobility comments: on very edge of bed and assisted to lower trunk and lift legs  Transfers Overall transfer level: Needs assistance   Transfers: Squat Pivot Transfers     Squat pivot transfers: Max assist;Total assist;+2 physical assistance     General transfer comment: very little initiation, poor anterior weight shift, pushing feet out and crossing legs despite repositioning for prep to transfer; recliner to w/c, then w/c to bed with assist of tech due to soiled with stool  Ambulation/Gait                Stairs            Wheelchair Mobility    Modified Rankin (Stroke Patients Only)       Balance Overall balance assessment: Needs assistance Sitting-balance support: Bilateral upper extremity supported Sitting balance-Leahy Scale: Poor Sitting balance - Comments: almost retropulsion especially on edge of bed; mod overall sitting edge of  chair Postural control: Posterior lean   Standing balance-Leahy Scale: Zero Standing balance comment: Little weight support through feet                             Pertinent Vitals/Pain Pain Assessment: Faces Faces Pain Scale: Hurts a little bit Pain Location: rectum Pain Descriptors / Indicators: Sore Pain Intervention(s): Repositioned;Other (comment) (cleaned stool from area)    Home Living Family/patient expects to be discharged to:: Skilled nursing facility                 Additional Comments: from [facility]    Prior Function Level of Independence: Needs assistance   Gait / Transfers Assistance Needed: per chart wheelchair bound at [place]        Extremity/Trunk Assessment               Lower Extremity Assessment: RLE deficits/detail;LLE deficits/detail RLE Deficits / Details: AAROM WFL except tight adductors and heel cords and increased tone (crosses legs at rest) LLE Deficits / Details: AAROM WFL except tight adductors and heel cords and increased tone (crosses legs at rest)  Cervical / Trunk Assessment: Kyphotic  Communication   Communication: HOH  Cognition Arousal/Alertness: Awake/alert Behavior During Therapy: Flat affect Overall Cognitive Status: No family/caregiver present to determine baseline cognitive functioning Area of Impairment: Orientation;Following commands Orientation  Level: Place;Time;Situation     Following Commands: Follows one step commands with increased time;Follows one step commands inconsistently            General Comments      Exercises        Assessment/Plan    PT Assessment All further PT needs can be met in the next venue of care  PT Diagnosis Generalized weakness   PT Problem List Decreased balance;Decreased mobility;Decreased range of motion;Decreased strength;Impaired tone  PT Treatment Interventions     PT Goals (Current goals can be found in the  Care Plan section) Acute Rehab PT Goals PT Goal Formulation: All assessment and education complete, DC therapy    Frequency     Barriers to discharge        Co-evaluation               End of Session   Activity Tolerance: Other (comment) (limited due to soiled/incontinent of stool) Patient left: in bed;with call bell/phone within reach;with bed alarm set           Time: 1100-1135 PT Time Calculation (min) (ACUTE ONLY): 35 min   Charges:   PT Evaluation $Initial PT Evaluation Tier I: 1 Procedure PT Treatments $Therapeutic Activity: 8-22 mins   PT G Codes:        WYNN,CYNDI 01/29/2015, 12:24 PM  Magda Kiel, [place] Jan 29, 2015

## 2015-01-26 ENCOUNTER — Telehealth: Payer: Self-pay | Admitting: *Deleted

## 2015-01-26 NOTE — Telephone Encounter (Signed)
Pt was on tcm list D/c 01/25/15 & sent to SNF...Shelley Navarro

## 2015-05-03 ENCOUNTER — Ambulatory Visit (INDEPENDENT_AMBULATORY_CARE_PROVIDER_SITE_OTHER): Payer: Medicare Other | Admitting: Podiatry

## 2015-05-03 NOTE — Progress Notes (Signed)
Patient ID: Shelley Navarro, female   DOB: 03/23/1921, 81 y.o.   MRN: [id number]   NO SHOW

## 2015-10-05 DEATH — deceased

## 2016-05-22 IMAGING — DX DG CHEST 1V PORT
1 series · 1 of 1 positions shown · non-contrast
Comparison: 10/14/2013 and earlier.

CLINICAL DATA: [AGE] who became acutely nonverbal at the
[HOSPITAL] earlier today. Acute onset of cough and chest
congestion.

EXAM:
PORTABLE CHEST - 1 VIEW

[chest ap]
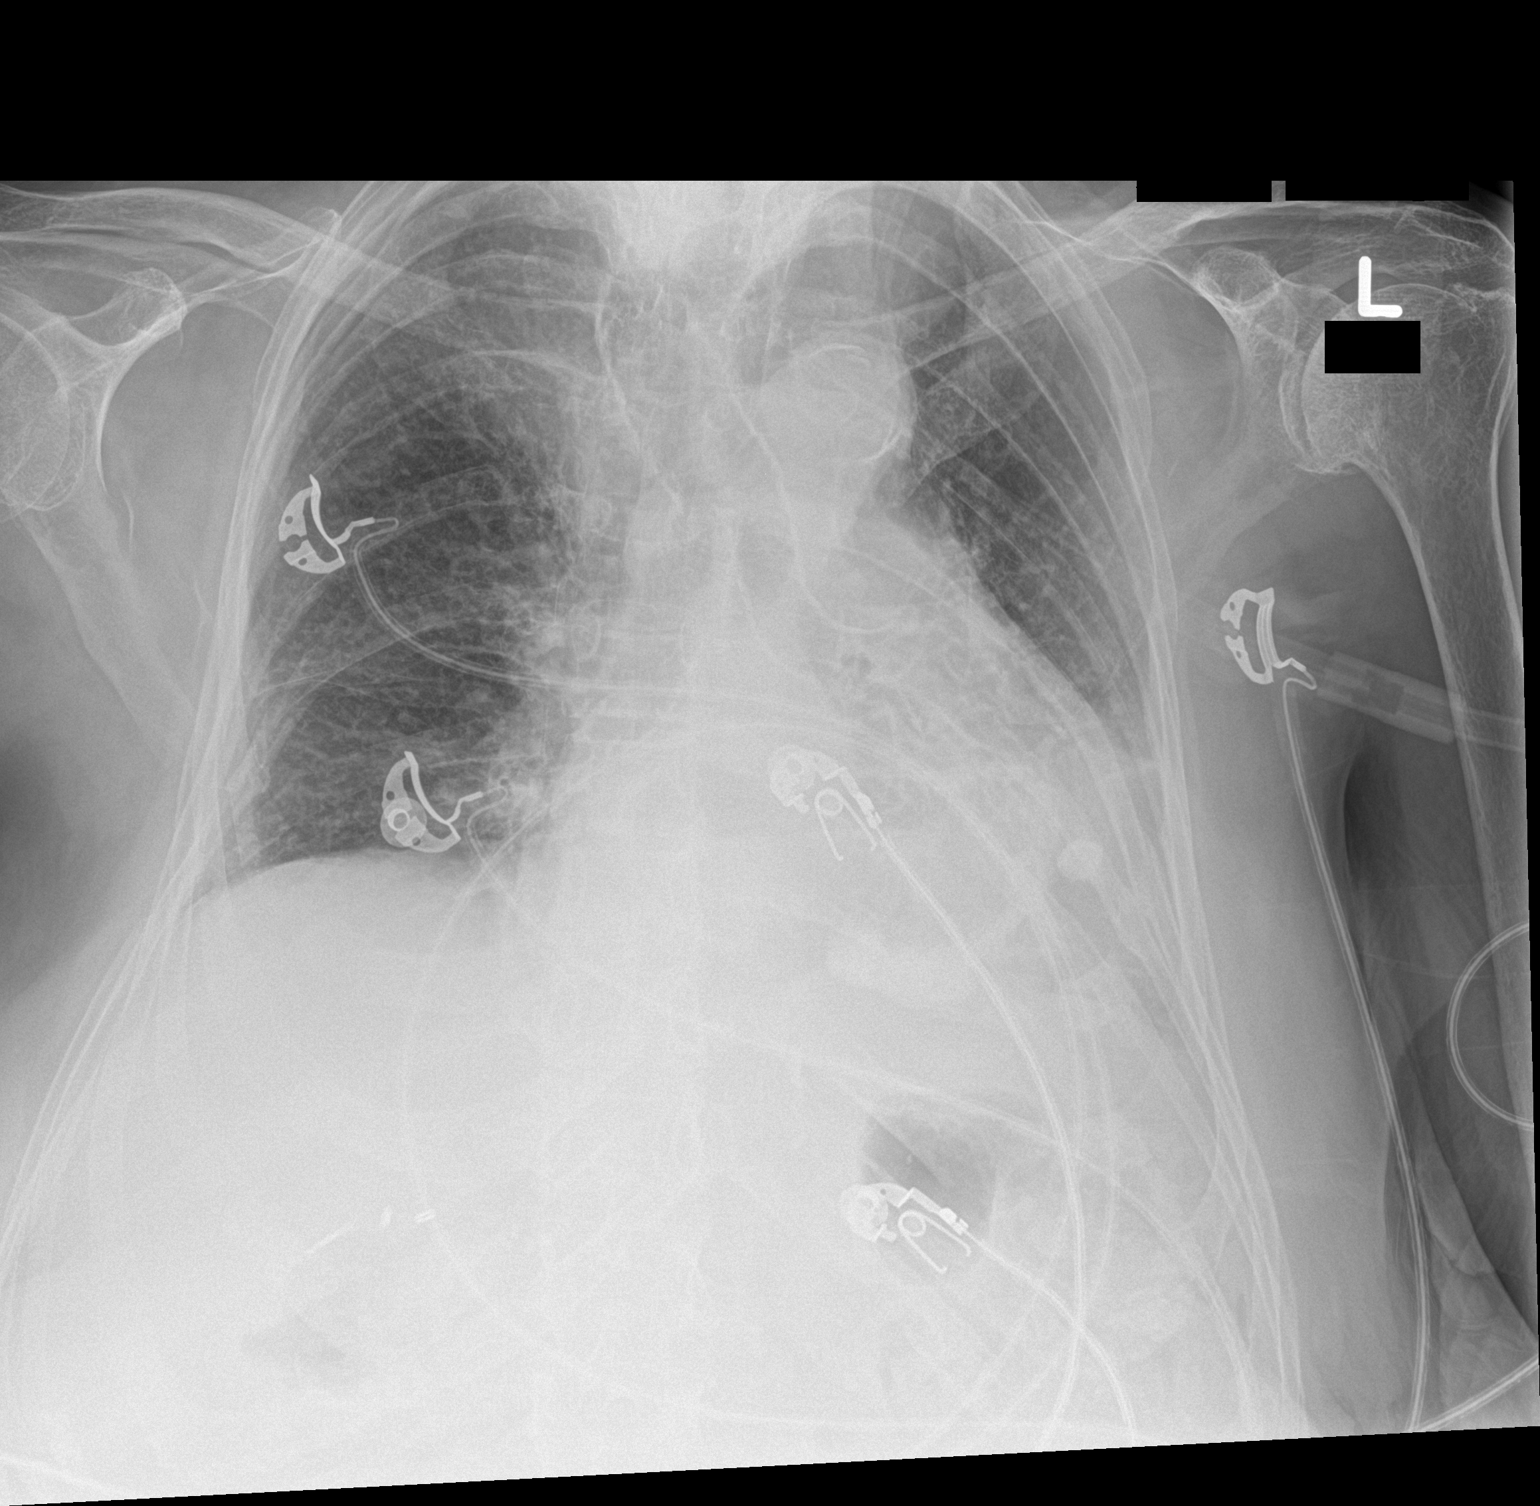

[1 of 1 positions shown; findings below may reference images not displayed]

FINDINGS: Cardiac silhouette moderately to markedly enlarged but stable.
Extensive mitral annular calcification. Thoracic aorta
atherosclerotic, unchanged. Hilar and mediastinal contours otherwise
unremarkable. Suboptimal inspiration accounts for crowding of the
bronchovascular markings diffusely and for atelectasis in the left
lower lobe and lingula. Densely calcified granuloma in the left
lower lobe, unchanged. Mild pulmonary venous hypertension without
overt edema.
IMPRESSION: 1. Suboptimal inspiration accounts for atelectasis in the left lower
lobe and lingula.
2. Stable moderate to marked cardiomegaly. Mild pulmonary venous
hypertension without overt edema.

## 2016-05-22 IMAGING — CT CT HEAD W/O CM
2 series · 16 of 30 positions shown, 20 images · non-contrast
Comparison: 11/18/2011

CLINICAL DATA: Change in mental status.

EXAM:
CT HEAD WITHOUT CONTRAST
TECHNIQUE: Contiguous axial images were obtained from the base of the skull
through the vertex without intravenous contrast.

[Series 201: head w/o, idose (1) · axial · non-contrast · 0.44mm/px · z∈[+60,+185]mm · 13 of 31 slices shown, 17 images]
[im 3/31  brain]
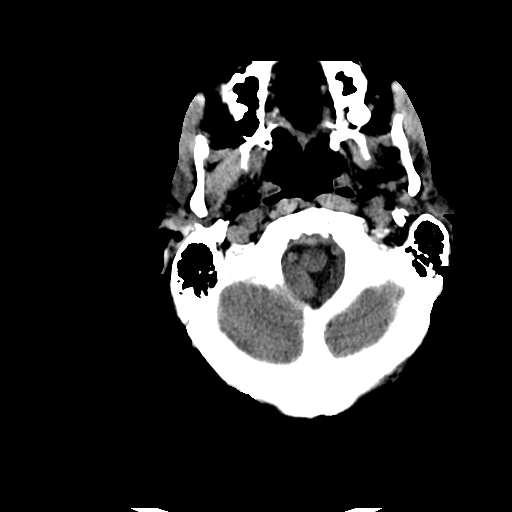
[im 3/31  bone]
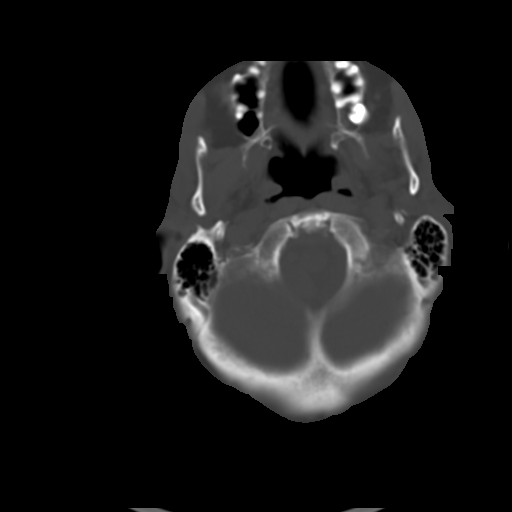
[im 5/31  brain]
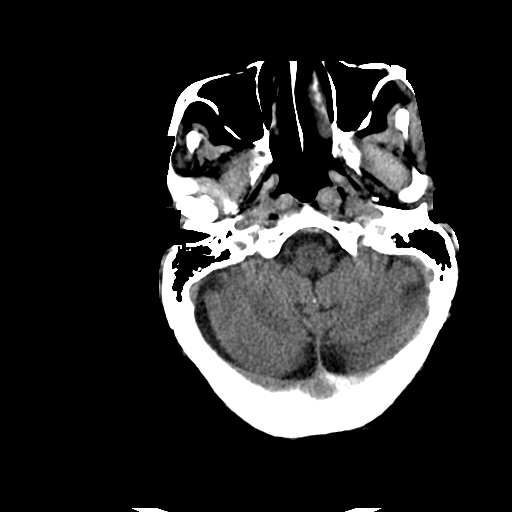
[im 7/31  brain]
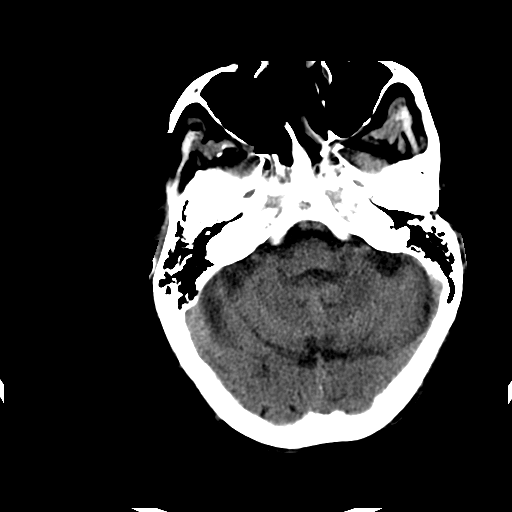
[im 9/31  brain]
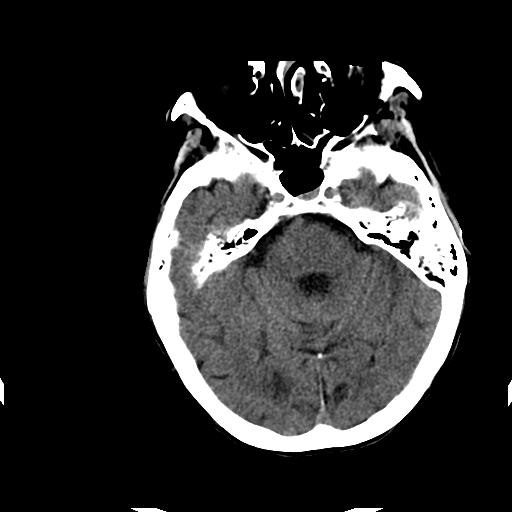
[im 11/31  brain]
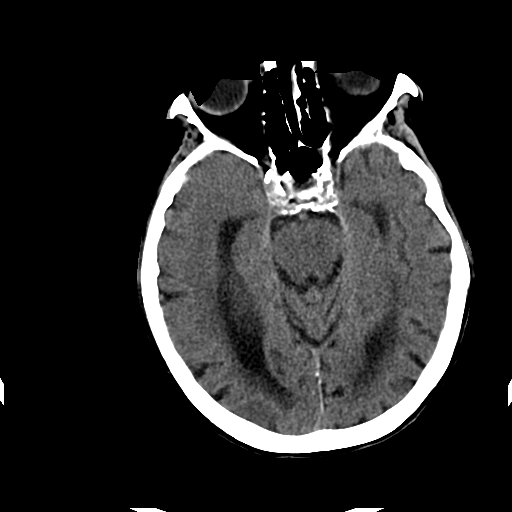
[im 11/31  bone]
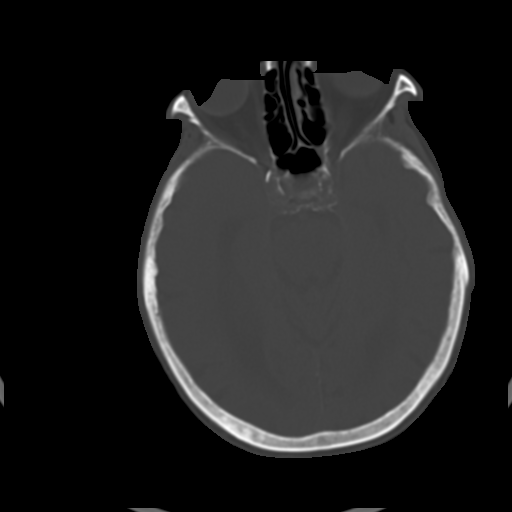
[im 13/31  brain]
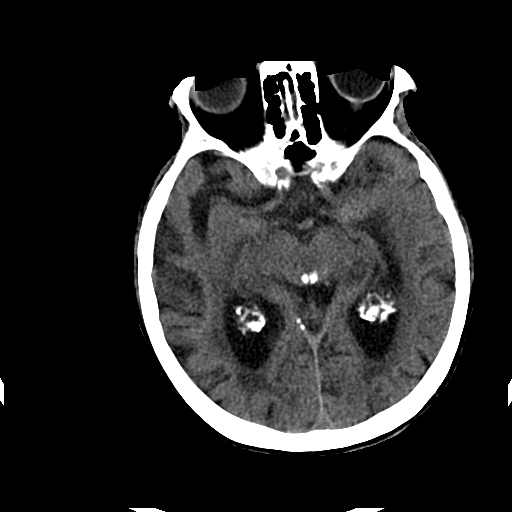
[im 16/31  brain]
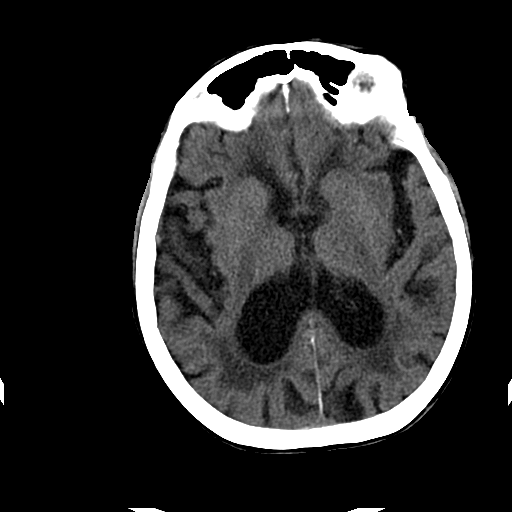
[im 18/31  brain]
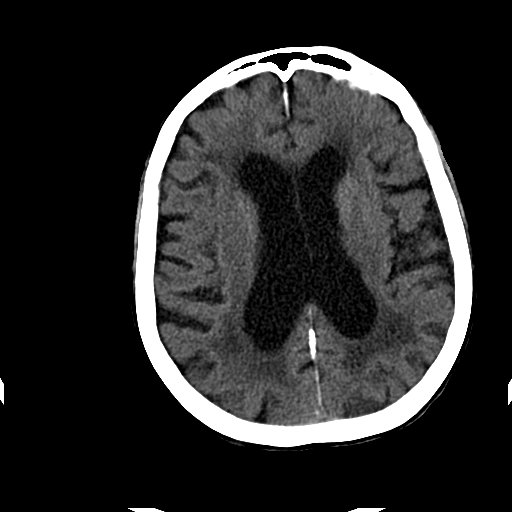
[im 20/31  brain]
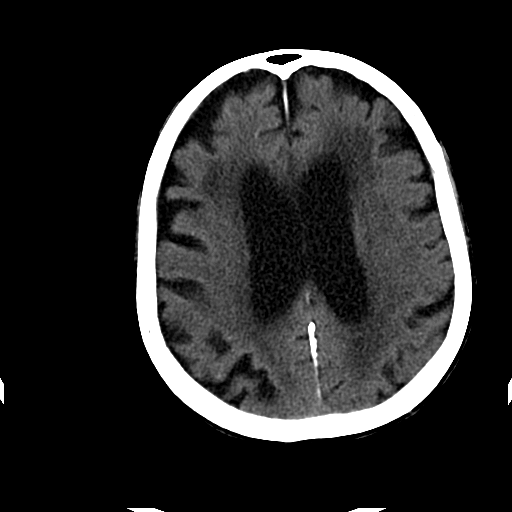
[im 20/31  bone]
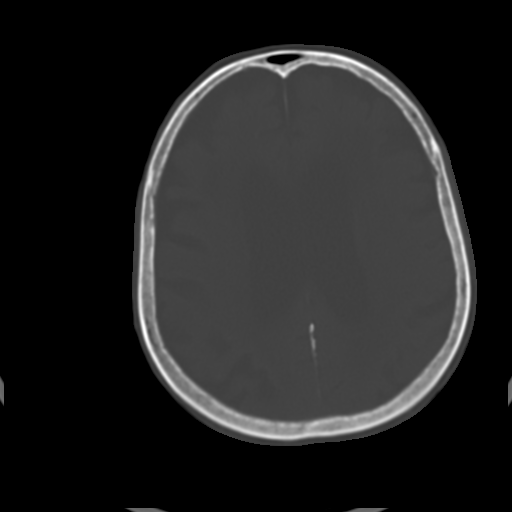
[im 22/31  brain]
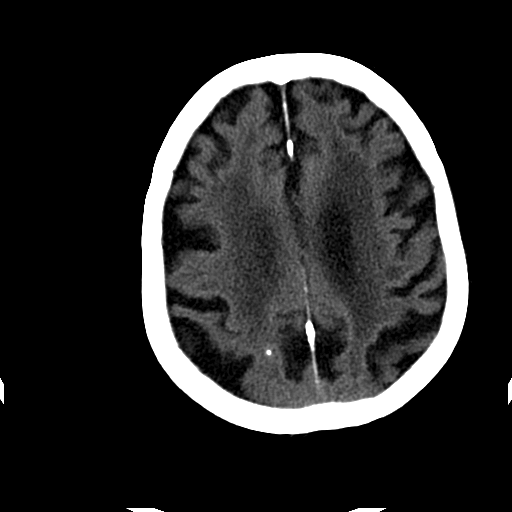
[im 24/31  brain]
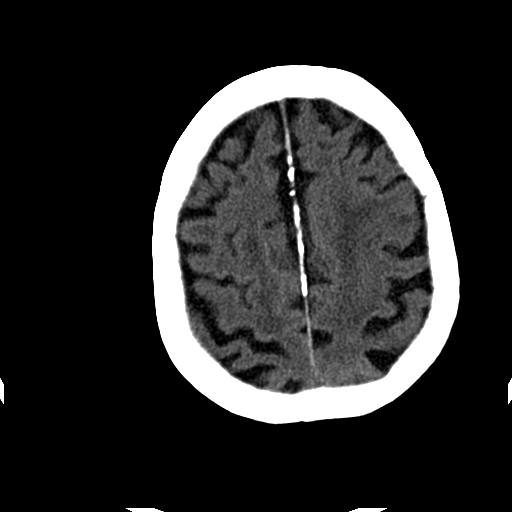
[im 26/31  brain]
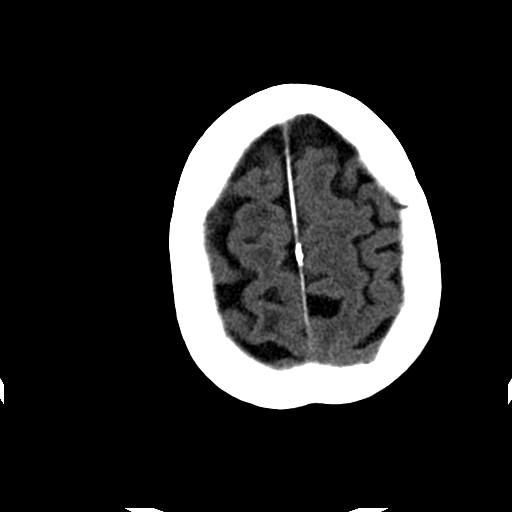
[im 28/31  brain]
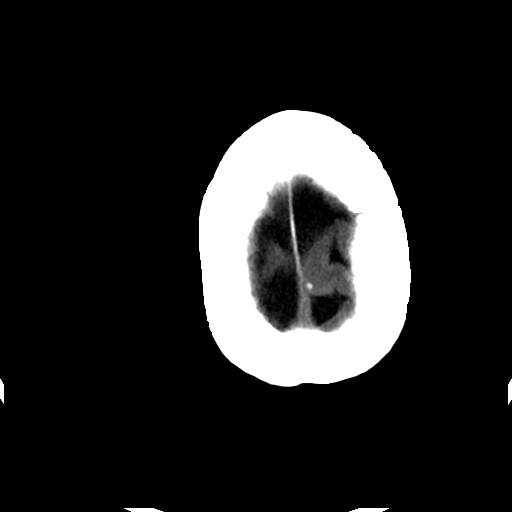
[im 28/31  bone]
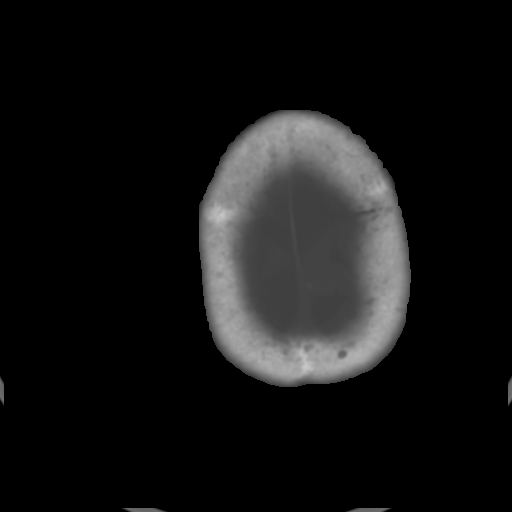

[Series 202: head w/o bone, idose (1) · axial · non-contrast · 0.44mm/px · z∈[+60,+100]mm · 3 of 31 slices shown]
[im 3/31  bone]
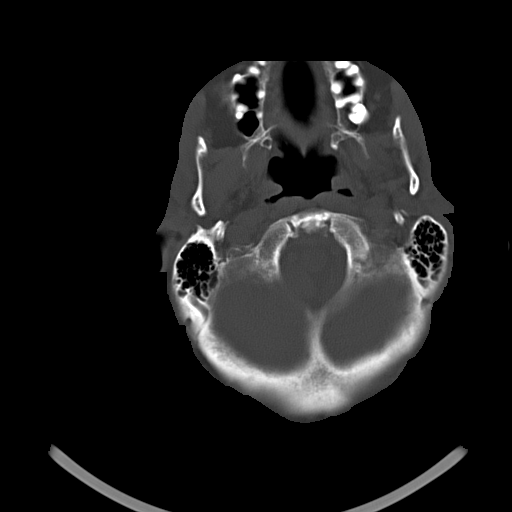
[im 7/31  bone]
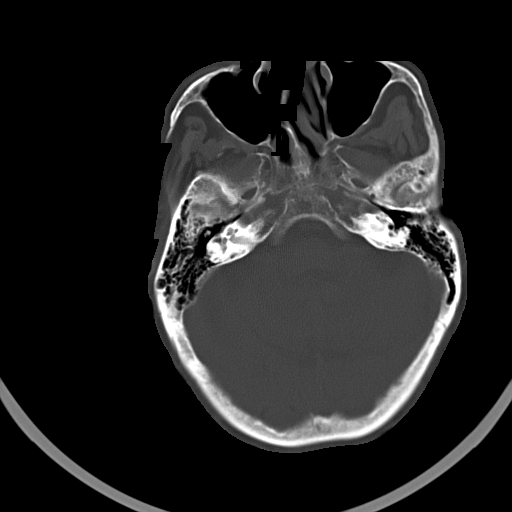
[im 11/31  bone]
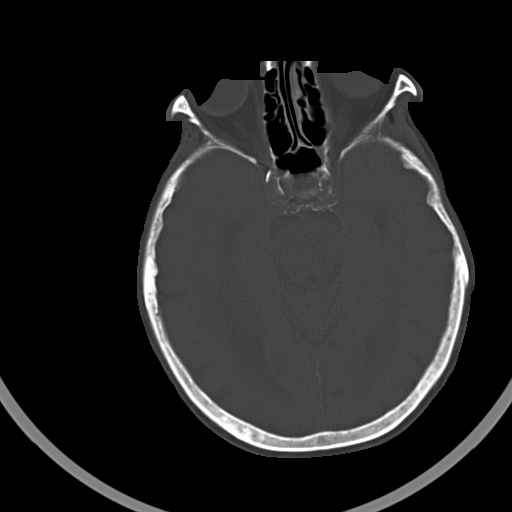

[16 of 30 positions shown; findings below may reference images not displayed]

FINDINGS: Ventricles, cisterns and other CSF spaces are mildly prominent
compatible with age related atrophic change. There is chronic
ischemic microvascular disease present. There is no mass, mass
effect, shift of midline structures or acute hemorrhage. There is no
evidence to suggest acute infarction. Remaining bones and soft
tissues are within normal.
IMPRESSION: No acute intracranial findings.

Age related atrophic change and chronic ischemic microvascular
disease.
